# Patient Record
Sex: Female | Born: 1955 | Race: White | Hispanic: No | Marital: Married | Smoking: Never smoker
Health system: Southern US, Community
[De-identification: ages and names within clinical notes are randomized; demographics above are authoritative.]

## PROBLEM LIST (undated history)

## (undated) DIAGNOSIS — E785 Hyperlipidemia, unspecified: Secondary | ICD-10-CM

## (undated) DIAGNOSIS — I10 Essential (primary) hypertension: Secondary | ICD-10-CM

## (undated) DIAGNOSIS — R7303 Prediabetes: Secondary | ICD-10-CM

## (undated) DIAGNOSIS — I509 Heart failure, unspecified: Secondary | ICD-10-CM

## (undated) HISTORY — PX: TUBAL LIGATION: SHX77

## (undated) HISTORY — DX: Heart failure, unspecified: I50.9

---

## 1999-07-11 ENCOUNTER — Other Ambulatory Visit: Admission: RE | Admit: 1999-07-11 | Discharge: 1999-07-11 | Payer: Self-pay | Admitting: Obstetrics and Gynecology

## 2015-04-04 ENCOUNTER — Emergency Department: Payer: Self-pay

## 2015-04-04 ENCOUNTER — Encounter: Payer: Self-pay | Admitting: Emergency Medicine

## 2015-04-04 ENCOUNTER — Emergency Department
Admission: EM | Admit: 2015-04-04 | Discharge: 2015-04-04 | Disposition: A | Discharge status: Home / Self Care | Payer: Self-pay | Attending: Emergency Medicine | Admitting: Emergency Medicine

## 2015-04-04 DIAGNOSIS — I10 Essential (primary) hypertension: Secondary | ICD-10-CM | POA: Insufficient documentation

## 2015-04-04 DIAGNOSIS — J40 Bronchitis, not specified as acute or chronic: Secondary | ICD-10-CM

## 2015-04-04 DIAGNOSIS — J209 Acute bronchitis, unspecified: Secondary | ICD-10-CM | POA: Insufficient documentation

## 2015-04-04 HISTORY — DX: Essential (primary) hypertension: I10

## 2015-04-04 LAB — CBC WITH DIFFERENTIAL/PLATELET
BASOS ABS: 0.1 10*3/uL (ref 0–0.1)
BASOS PCT: 1 %
EOS PCT: 1 %
Eosinophils Absolute: 0.3 10*3/uL (ref 0–0.7)
HCT: 46 % (ref 35.0–47.0)
Hemoglobin: 15.3 g/dL (ref 12.0–16.0)
Lymphocytes Relative: 9 %
Lymphs Abs: 1.6 10*3/uL (ref 1.0–3.6)
MCH: 29.6 pg (ref 26.0–34.0)
MCHC: 33.2 g/dL (ref 32.0–36.0)
MCV: 89.1 fL (ref 80.0–100.0)
MONO ABS: 1 10*3/uL — AB (ref 0.2–0.9)
Monocytes Relative: 6 %
NEUTROS ABS: 14.4 10*3/uL — AB (ref 1.4–6.5)
NEUTROS PCT: 83 %
PLATELETS: 267 10*3/uL (ref 150–440)
RBC: 5.17 MIL/uL (ref 3.80–5.20)
RDW: 13.3 % (ref 11.5–14.5)
WBC: 17.3 10*3/uL — AB (ref 3.6–11.0)

## 2015-04-04 LAB — BASIC METABOLIC PANEL
ANION GAP: 11 (ref 5–15)
BUN: 10 mg/dL (ref 6–20)
CALCIUM: 9.1 mg/dL (ref 8.9–10.3)
CO2: 25 mmol/L (ref 22–32)
Chloride: 104 mmol/L (ref 101–111)
Creatinine, Ser: 0.74 mg/dL (ref 0.44–1.00)
Glucose, Bld: 140 mg/dL — ABNORMAL HIGH (ref 65–99)
Potassium: 3.3 mmol/L — ABNORMAL LOW (ref 3.5–5.1)
Sodium: 140 mmol/L (ref 135–145)

## 2015-04-04 MED ORDER — IOHEXOL 350 MG/ML SOLN
75.0000 mL | Freq: Once | INTRAVENOUS | Status: AC | PRN
  Administered 2015-04-04: 75 mL via INTRAVENOUS

## 2015-04-04 MED ORDER — AZITHROMYCIN 250 MG PO TABS
ORAL_TABLET | ORAL | Status: AC
Start: 2015-04-04 — End: 2015-04-09

## 2015-04-04 MED ORDER — ALBUTEROL SULFATE (2.5 MG/3ML) 0.083% IN NEBU
5.0000 mg | INHALATION_SOLUTION | Freq: Once | RESPIRATORY_TRACT | Status: AC
  Administered 2015-04-04: 2.5 mg via RESPIRATORY_TRACT
  Filled 2015-04-04: qty 6

## 2015-04-04 MED ORDER — ALBUTEROL SULFATE HFA 108 (90 BASE) MCG/ACT IN AERS: 2.0000 | INHALATION_SPRAY | Freq: Four times a day (QID) | RESPIRATORY_TRACT | Status: DC | PRN

## 2015-04-04 MED ORDER — METHYLPREDNISOLONE SODIUM SUCC 125 MG IJ SOLR
125.0000 mg | Freq: Once | INTRAMUSCULAR | Status: AC
  Administered 2015-04-04: 125 mg via INTRAVENOUS
  Filled 2015-04-04: qty 2

## 2015-04-04 MED ORDER — IPRATROPIUM-ALBUTEROL 0.5-2.5 (3) MG/3ML IN SOLN
3.0000 mL | Freq: Once | RESPIRATORY_TRACT | Status: AC
  Administered 2015-04-04: 3 mL via RESPIRATORY_TRACT

## 2015-04-04 MED ORDER — IPRATROPIUM-ALBUTEROL 0.5-2.5 (3) MG/3ML IN SOLN
RESPIRATORY_TRACT | Status: AC
  Administered 2015-04-04: 3 mL via RESPIRATORY_TRACT
  Filled 2015-04-04: qty 3

## 2015-04-04 MED ORDER — PREDNISONE 20 MG PO TABS: 60.0000 mg | ORAL_TABLET | Freq: Every day | ORAL | Status: DC

## 2015-04-04 MED ORDER — AZITHROMYCIN 250 MG PO TABS
500.0000 mg | ORAL_TABLET | Freq: Once | ORAL | Status: AC
  Administered 2015-04-04: 500 mg via ORAL
  Filled 2015-04-04: qty 2

## 2015-04-04 MED ORDER — SODIUM CHLORIDE 0.9 % IV BOLUS (SEPSIS)
500.0000 mL | Freq: Once | INTRAVENOUS | Status: AC
  Administered 2015-04-04: 500 mL via INTRAVENOUS

## 2015-04-04 NOTE — ED Provider Notes (Addendum)
[facility] Emergency Department Provider Note  ____________________________________________   I have reviewed the triage vital signs and the nursing notes.   HISTORY  Chief Complaint Sore Throat; Nasal Congestion; and Cough    HPI Pamela Kramer is a 59 y.o. female with a history of hypertension, poor compliance, does not usually go to doctors, presents today with URI symptoms including runny nose, cough, and wheeze. Cough is occasionally productive. No fever. States it all started today or yesterday. Feels like possibly pneumonia. Has not had any chest pain. Denies leg swelling. Patient was given an albuterol neb here and feels much better. Positive sick contacts at home.  Past Medical History  Diagnosis Date  . Hypertension     There are no active problems to display for this patient.   Past Surgical History  Procedure Laterality Date  . Tubal ligation      No current outpatient prescriptions on file.  Allergies Review of patient's allergies indicates no known allergies.  History reviewed. No pertinent family history.  Social History Social History  Substance Use Topics  . Smoking status: Never Smoker   . Smokeless tobacco: None  . Alcohol Use: 0.6 oz/week    1 Glasses of wine per week    Review of Systems Constitutional: No fever/chills Eyes: No visual changes. ENT: Positive sore throat. No stiff neck no neck pain positive rhinorrhea Cardiovascular: Denies chest pain. Respiratory: Positive shortness of breath. Gastrointestinal:   no vomiting.  No diarrhea.  No constipation. Genitourinary: Negative for dysuria. Musculoskeletal: Negative lower extremity swelling Skin: Negative for rash. Neurological: Negative for headaches, focal weakness or numbness. 10-point ROS otherwise negative.  ____________________________________________   PHYSICAL EXAM:  VITAL SIGNS: ED Triage Vitals  Enc Vitals Group     BP 04/04/15 1701 162/108  mmHg     Pulse Rate 04/04/15 1701 109     Resp 04/04/15 1701 18     Temp 04/04/15 1701 98.6 F (37 C)     Temp Source 04/04/15 1701 Oral     SpO2 04/04/15 1701 95 %     Weight 04/04/15 1701 280 lb (127.007 kg)     Height 04/04/15 1701  (1.676 m)     Head Cir --      Peak Flow --      Pain Score 04/04/15 1827 4     Pain Loc --      Pain Edu? --      Excl. in GC? --     Constitutional: Alert and oriented. Well appearing and in no acute distress. Eyes: Conjunctivae are normal. PERRL. EOMI. Head: Atraumatic. Nose: Positive minor congestion/rhinnorhea. Mouth/Throat: Mucous membranes are moist.  Oropharynx slightly erythematous Neck: No stridor.   Nontender with no meningismus Cardiovascular: Normal rate, regular rhythm. Grossly normal heart sounds.  Good peripheral circulation. Respiratory: Normal respiratory effort.  No retractions. Good air movement bilaterally with Slightly diminished in the bases Abdominal: Soft and nontender. No distention. No guarding no rebound morbidly obese Back:  There is no focal tenderness or step off there is no midline tenderness there are no lesions noted. there is no CVA tenderness  Musculoskeletal: No lower extremity tenderness. No joint effusions, no DVT signs strong distal pulses no edema Neurologic:  Normal speech and language. No gross focal neurologic deficits are appreciated.  Skin:  Skin is warm, dry and intact. No rash noted. Psychiatric: Mood and affect are normal. Speech and behavior are normal.  ____________________________________________   LABS (all labs ordered are  listed, but only abnormal results are displayed)  Labs Reviewed  CBC WITH DIFFERENTIAL/PLATELET - Abnormal; Notable for the following:    WBC 17.3 (*)    Neutro Abs 14.4 (*)    Monocytes Absolute 1.0 (*)    All other components within normal limits  BASIC METABOLIC PANEL - Abnormal; Notable for the following:    Potassium 3.3 (*)    Glucose, Bld 140 (*)    All  other components within normal limits   ____________________________________________  EKG  I personally interpreted any EKGs ordered by me or triage EKG shows sinus tachycardia, rate 102 no acute ST elevation or ST depression normal axis. Unremarkable EKG ____________________________________________  RADIOLOGY  I reviewed any imaging ordered by me or triage that were performed during my shift ____________________________________________   PROCEDURES  Procedure(s) performed: None  Critical Care performed: None  ____________________________________________   INITIAL IMPRESSION / ASSESSMENT AND PLAN / ED COURSE  Pertinent labs & imaging results that were available during my care of the patient were reviewed by me and considered in my medical decision making (see chart for details).  Patient here with viral URI symptoms of short duration, low suspicion for PE ACS dissection myocarditis endocarditis pericarditis or other acute intrathoracic pathology such as dissection however, chest x-ray which does not show an infiltrate, which is consistent with exam, does show questionable mediastinal mass and they've requested CT which we have obtained. As we are obtaining a CT at the request of radiology, we are checking blood work. Her white count is mildly elevated, her renal function is reassuring. We'll obtain a CT scan as a precaution. Patient doing much better after the nebulizer. Given that she was wheezing initially, we are giving her Solu-Medrol. We will see if there is anything on CT. I did offer to have the patient follow-up as an outpatient for the CT scan, but she states she has no good follow-up already established although she can always go see her husband's doctor she needs to, and she asked that we make sure everything was okay prior to discharge. Patient does have elevated blood pressure here but she has a long history of it it is a symptomatically she has intact renal function no  evidence of pulmonary edema at this time, no evidence of acute ischemic change, and we will defer further management of her hypertension to her outpatient physician whom she will follow up with closely she states ____________________________________________ ----------------------------------------- 8:57 PM on 04/04/2015 -----------------------------------------  As anticipated, CT scan is unremarkable aside from what is likely a viral process. All the findings have been explained to her. 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 We'll reassess patient and see how she is feeling at this time. Slight tachycardia is noted after albuterol. CT does show some "bronchitic" changes in the left lung. Focal changes this variety could be a precursor to pneumonia and I will give her antibiotics I feel this might help her. She is in no respiratory distress she does feel like she would benefit from another albuterol we'll give her that that before discharge this I and this we will elevate her heart rate which we will tolerate. Patient states that she has never been on blood pressure medication but been told that she has high blood pressure before. I will not start that at this moment but we will advise close follow-up and have advised close follow-up as an outpatient for recheck of blood pressure.Marland Kitchen  FINAL CLINICAL IMPRESSION(S) / ED DIAGNOSES  Final diagnoses:  None     Jeanmarie PlantJames A Raela Bohl, MD 04/04/15 2042  Jeanmarie PlantJames A Alaiah Lundy, MD 04/04/15 [id number]  Jeanmarie PlantJames A Keino Placencia, MD 04/04/15 2104

## 2015-04-04 NOTE — ED Notes (Signed)
MD Mcshane at bedside. 

## 2015-04-04 NOTE — ED Notes (Addendum)
Pt presents to ER with chest congestion and sore throat since yesterday. " I feel like I got pneumonia"

## 2015-04-04 NOTE — Discharge Instructions (Signed)
We ask that you follow-up closely with her primary care doctor or your husbands if he can get in to see them. Otherwise we will refer you to 1 here. 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 There is nothing to do for that emergently but we asked him to follow closely with your doctor. In addition, your blood pressure was elevated here, we will not start blood pressure medication as that is not the role of the emergency department at this time but we do asked to follow closely as an outpatient to get her blood pressure rechecked and possibly structures off on blood pressure medication. If you have chest pain shortness of breath nausea vomiting weakness or you feel worse in any way return emergently to the emergency department.

## 2015-04-04 NOTE — ED Notes (Signed)
Patient transported to CT 

## 2015-04-04 NOTE — ED Notes (Signed)
Pt will be d.c once meds are given and fluid bolus completes. Pt made aware and verbalized understanding at this time

## 2017-03-05 DIAGNOSIS — E1159 Type 2 diabetes mellitus with other circulatory complications: Secondary | ICD-10-CM | POA: Diagnosis present

## 2017-03-06 DIAGNOSIS — E119 Type 2 diabetes mellitus without complications: Secondary | ICD-10-CM

## 2017-03-06 DIAGNOSIS — E1169 Type 2 diabetes mellitus with other specified complication: Secondary | ICD-10-CM | POA: Diagnosis present

## 2017-05-08 ENCOUNTER — Emergency Department
Admission: EM | Admit: 2017-05-08 | Discharge: 2017-05-08 | Disposition: A | Discharge status: Home / Self Care | Payer: Self-pay | Attending: Emergency Medicine | Admitting: Emergency Medicine

## 2017-05-08 ENCOUNTER — Emergency Department: Payer: Self-pay

## 2017-05-08 ENCOUNTER — Encounter: Payer: Self-pay | Admitting: Emergency Medicine

## 2017-05-08 DIAGNOSIS — Y92009 Unspecified place in unspecified non-institutional (private) residence as the place of occurrence of the external cause: Secondary | ICD-10-CM | POA: Insufficient documentation

## 2017-05-08 DIAGNOSIS — Y999 Unspecified external cause status: Secondary | ICD-10-CM | POA: Insufficient documentation

## 2017-05-08 DIAGNOSIS — Y939 Activity, unspecified: Secondary | ICD-10-CM | POA: Insufficient documentation

## 2017-05-08 DIAGNOSIS — Z7984 Long term (current) use of oral hypoglycemic drugs: Secondary | ICD-10-CM | POA: Insufficient documentation

## 2017-05-08 DIAGNOSIS — X501XXA Overexertion from prolonged static or awkward postures, initial encounter: Secondary | ICD-10-CM | POA: Insufficient documentation

## 2017-05-08 DIAGNOSIS — R7303 Prediabetes: Secondary | ICD-10-CM | POA: Insufficient documentation

## 2017-05-08 DIAGNOSIS — I10 Essential (primary) hypertension: Secondary | ICD-10-CM | POA: Insufficient documentation

## 2017-05-08 DIAGNOSIS — S93491A Sprain of other ligament of right ankle, initial encounter: Secondary | ICD-10-CM | POA: Insufficient documentation

## 2017-05-08 HISTORY — DX: Prediabetes: R73.03

## 2017-05-08 HISTORY — DX: Hyperlipidemia, unspecified: E78.5

## 2017-05-08 MED ORDER — TRAMADOL HCL 50 MG PO TABS: 50.0000 mg | ORAL_TABLET | Freq: Four times a day (QID) | ORAL | 0 refills | Status: AC | PRN

## 2017-05-08 NOTE — ED Triage Notes (Signed)
Patient presents to ED via POV from home with c/o right foot pain since Sunday. Patient states she slipped and fell on the ice and hurt her foot. Denies LOC or hitting head.

## 2017-05-08 NOTE — ED Notes (Signed)
See triage note  States she rolled her ankle earlier today  Increased apin and swelling noted  Positive pulses noted

## 2017-05-08 NOTE — ED Provider Notes (Signed)
[facility] Emergency Department Provider Note  ____________________________________________  Time seen: Approximately 3:03 PM  I have reviewed the triage vital signs and the nursing notes.   HISTORY  Chief Complaint Foot Pain    HPI Pamela Kramer is a 61 y.o. female presents to the emergency department with right ankle pain after patient sustained an inversion type ankle injury 2 days ago.  Patient denies hitting her head during fall.  She denies weakness, radiculopathy or changes in sensation of the lower extremities.  No skin compromise.  Patient currently rates her pain at 8 out of 10 in intensity.  No alleviating measures have been attempted.   Past Medical History:  Diagnosis Date  . Hyperlipidemia   . Hypertension   . Pre-diabetes     There are no active problems to display for this patient.   Past Surgical History:  Procedure Laterality Date  . TUBAL LIGATION      Prior to Admission medications   Medication Sig Start Date End Date Taking? Authorizing Provider  losartan (COZAAR) 50 MG tablet Take 50 mg by mouth daily.   Yes [provider]  metFORMIN (GLUCOPHAGE) 500 MG tablet Take 500 mg by mouth 2 (two) times daily with a meal.   Yes [provider]  albuterol (PROVENTIL HFA;VENTOLIN HFA) 108 (90 BASE) MCG/ACT inhaler Inhale 2 puffs into the lungs every 6 (six) hours as needed for wheezing or shortness of breath. 04/04/15   Jeanmarie PlantMcShane, James A, MD  predniSONE (DELTASONE) 20 MG tablet Take 3 tablets (60 mg total) by mouth daily. 04/04/15   Jeanmarie PlantMcShane, James A, MD  traMADol (ULTRAM) 50 MG tablet Take 1 tablet (50 mg total) by mouth every 6 (six) hours as needed for up to 3 days. 05/08/17 05/11/17  Orvil FeilWoods, Desere Gwin M, PA-C    Allergies Patient has no known allergies.  No family history on file.  Social History Social History   Tobacco Use  . Smoking status: Never Smoker  Substance Use Topics  . Alcohol use: Yes   Alcohol/week: 0.6 oz    Types: 1 Glasses of wine per week  . Drug use: No     Review of Systems  Constitutional: No fever/chills Eyes: No visual changes. No discharge ENT: No upper respiratory complaints. Cardiovascular: no chest pain. Respiratory: no cough. No SOB. Musculoskeletal: Patient has right ankle pain. Skin: Negative for rash, abrasions, lacerations, ecchymosis. Neurological: Negative for headaches, focal weakness or numbness.   ____________________________________________   PHYSICAL EXAM:  VITAL SIGNS: ED Triage Vitals [05/08/17 1259]  Enc Vitals Group     BP (!) 146/79     Pulse Rate 84     Resp 16     Temp 98.2 F (36.8 C)     Temp Source Oral     SpO2 94 %     Weight 286 lb (129.7 kg)     Height 5\' 6"  (1.676 m)     Head Circumference      Peak Flow      Pain Score 8     Pain Loc      Pain Edu?      Excl. in GC?      Constitutional: Alert and oriented. Well appearing and in no acute distress. Eyes: Conjunctivae are normal. PERRL. EOMI. Head: Atraumatic. Cardiovascular: Normal rate, regular rhythm. Normal S1 and S2.  Good peripheral circulation. Respiratory: Normal respiratory effort without tachypnea or retractions. Lungs CTAB. Good air entry to the bases with no decreased or absent  breath sounds. Musculoskeletal: Patient is able to perform limited range of motion at the right ankle, likely secondary to pain.  She is able to move all 5 right toes and has no pain with palpation over the right fibula.  Palpable dorsalis pedis pulse bilaterally and symmetrically. Neurologic:  Normal speech and language. No gross focal neurologic deficits are appreciated.  Skin:  Skin is warm, dry and intact. No rash noted. Psychiatric: Mood and affect are normal. Speech and behavior are normal. Patient exhibits appropriate insight and judgement.   ____________________________________________   LABS (all labs ordered are listed, but only abnormal results are  displayed)  Labs Reviewed - No data to display ____________________________________________  EKG   ____________________________________________  RADIOLOGY Geraldo PitterI, Jaedan Huttner M Miquel Stacks, personally viewed and evaluated these images (plain radiographs) as part of my medical decision making, as well as reviewing the written report by the radiologist.  Dg Foot Complete Right  Result Date: 05/08/2017 CLINICAL DATA:  Right foot pain since patient suffered a slip and fall on ice 05/06/2017. Initial encounter. EXAM: RIGHT FOOT COMPLETE - 3+ VIEW COMPARISON:  None. FINDINGS: There is no evidence of fracture or dislocation. There is no evidence of arthropathy or other focal bone abnormality. Plantar calcaneal spur is noted. Scattered soft tissue calcifications are most consistent with some remote infectious or inflammatory process. IMPRESSION: No acute abnormality. Electronically Signed   By: Drusilla Kannerhomas  Dalessio M.D.   On: 05/08/2017 14:25    ____________________________________________    PROCEDURES  Procedure(s) performed:    Procedures    Medications - No data to display   ____________________________________________   INITIAL IMPRESSION / ASSESSMENT AND PLAN / ED COURSE  Pertinent labs & imaging results that were available during my care of the patient were reviewed by me and considered in my medical decision making (see chart for details).  Review of the  [place] was performed in accordance of the [facility] prior to dispensing any controlled drugs.    Assessment and plan Right ankle sprain Patient presents to the emergency department with right ankle pain after sustaining an inversion type ankle injury.  Differential diagnosis originally included fracture versus ankle sprain.  History and physical exam findings are consistent with ankle sprain.  Ace wrap was applied in the emergency department and crutches were provided.  Patient was discharged with a short course of tramadol and she was  referred to podiatry.  All patient questions were answered.   ____________________________________________  FINAL CLINICAL IMPRESSION(S) / ED DIAGNOSES  Final diagnoses:  Sprain of anterior talofibular ligament of right ankle, initial encounter      NEW MEDICATIONS STARTED DURING THIS VISIT:  ED Discharge Orders        Ordered    traMADol (ULTRAM) 50 MG tablet  Every 6 hours PRN     05/08/17 1508          This chart was dictated using voice recognition software/Dragon. Despite best efforts to proofread, errors can occur which can change the meaning. Any change was purely unintentional.    Orvil FeilWoods, Kayvon Mo M, PA-C 05/08/17 1512    Pershing ProudSchaevitz, Myra Rudeavid Matthew, MD 05/09/17 1311

## 2019-03-11 ENCOUNTER — Encounter: Payer: Self-pay | Admitting: Emergency Medicine

## 2019-03-11 ENCOUNTER — Other Ambulatory Visit: Payer: Self-pay

## 2019-03-11 ENCOUNTER — Emergency Department: Payer: Self-pay

## 2019-03-11 ENCOUNTER — Inpatient Hospital Stay
Admission: EM | Admit: 2019-03-11 | Discharge: 2019-03-15 | DRG: 291 | Disposition: A | Discharge status: Home / Self Care | Payer: Self-pay | Attending: Internal Medicine | Admitting: Internal Medicine

## 2019-03-11 DIAGNOSIS — E876 Hypokalemia: Secondary | ICD-10-CM | POA: Diagnosis present

## 2019-03-11 DIAGNOSIS — R7303 Prediabetes: Secondary | ICD-10-CM | POA: Diagnosis present

## 2019-03-11 DIAGNOSIS — Z23 Encounter for immunization: Secondary | ICD-10-CM

## 2019-03-11 DIAGNOSIS — I509 Heart failure, unspecified: Secondary | ICD-10-CM

## 2019-03-11 DIAGNOSIS — E785 Hyperlipidemia, unspecified: Secondary | ICD-10-CM | POA: Diagnosis present

## 2019-03-11 DIAGNOSIS — Z7952 Long term (current) use of systemic steroids: Secondary | ICD-10-CM

## 2019-03-11 DIAGNOSIS — Z7984 Long term (current) use of oral hypoglycemic drugs: Secondary | ICD-10-CM

## 2019-03-11 DIAGNOSIS — I110 Hypertensive heart disease with heart failure: Principal | ICD-10-CM | POA: Diagnosis present

## 2019-03-11 DIAGNOSIS — Z79899 Other long term (current) drug therapy: Secondary | ICD-10-CM

## 2019-03-11 DIAGNOSIS — Z886 Allergy status to analgesic agent status: Secondary | ICD-10-CM

## 2019-03-11 DIAGNOSIS — Z6841 Body Mass Index (BMI) 40.0 and over, adult: Secondary | ICD-10-CM

## 2019-03-11 DIAGNOSIS — Z77120 Contact with and (suspected) exposure to mold (toxic): Secondary | ICD-10-CM

## 2019-03-11 DIAGNOSIS — I5021 Acute systolic (congestive) heart failure: Secondary | ICD-10-CM | POA: Diagnosis present

## 2019-03-11 DIAGNOSIS — J9601 Acute respiratory failure with hypoxia: Secondary | ICD-10-CM | POA: Diagnosis present

## 2019-03-11 DIAGNOSIS — Z20828 Contact with and (suspected) exposure to other viral communicable diseases: Secondary | ICD-10-CM | POA: Diagnosis present

## 2019-03-11 LAB — CBC WITH DIFFERENTIAL/PLATELET
Abs Immature Granulocytes: 0.03 10*3/uL (ref 0.00–0.07)
Basophils Absolute: 0 10*3/uL (ref 0.0–0.1)
Basophils Relative: 0 %
Eosinophils Absolute: 0.2 10*3/uL (ref 0.0–0.5)
Eosinophils Relative: 2 %
HCT: 44.8 % (ref 36.0–46.0)
Hemoglobin: 14.4 g/dL (ref 12.0–15.0)
Immature Granulocytes: 0 %
Lymphocytes Relative: 13 %
Lymphs Abs: 1.3 10*3/uL (ref 0.7–4.0)
MCH: 29.9 pg (ref 26.0–34.0)
MCHC: 32.1 g/dL (ref 30.0–36.0)
MCV: 93.1 fL (ref 80.0–100.0)
Monocytes Absolute: 0.5 10*3/uL (ref 0.1–1.0)
Monocytes Relative: 6 %
Neutro Abs: 7.5 10*3/uL (ref 1.7–7.7)
Neutrophils Relative %: 79 %
Platelets: 258 10*3/uL (ref 150–400)
RBC: 4.81 MIL/uL (ref 3.87–5.11)
RDW: 13.2 % (ref 11.5–15.5)
WBC: 9.6 10*3/uL (ref 4.0–10.5)
nRBC: 0 % (ref 0.0–0.2)

## 2019-03-11 LAB — COMPREHENSIVE METABOLIC PANEL
ALT: 44 U/L (ref 0–44)
AST: 39 U/L (ref 15–41)
Albumin: 4.1 g/dL (ref 3.5–5.0)
Alkaline Phosphatase: 80 U/L (ref 38–126)
Anion gap: 13 (ref 5–15)
BUN: 16 mg/dL (ref 8–23)
CO2: 25 mmol/L (ref 22–32)
Calcium: 9 mg/dL (ref 8.9–10.3)
Chloride: 102 mmol/L (ref 98–111)
Creatinine, Ser: 0.84 mg/dL (ref 0.44–1.00)
GFR calc Af Amer: >60 mL/min (ref >60)
GFR calc non Af Amer: >60 mL/min (ref >60)
Glucose, Bld: 139 mg/dL — ABNORMAL HIGH (ref 70–99)
Potassium: 4.3 mmol/L (ref 3.5–5.1)
Sodium: 140 mmol/L (ref 135–145)
Total Bilirubin: 0.7 mg/dL (ref 0.3–1.2)
Total Protein: 7.2 g/dL (ref 6.5–8.1)

## 2019-03-11 LAB — HEMOGLOBIN A1C
Hgb A1c MFr Bld: 6.1 % — ABNORMAL HIGH (ref 4.8–5.6)
Mean Plasma Glucose: 128.37 mg/dL

## 2019-03-11 LAB — TROPONIN I (HIGH SENSITIVITY): Troponin I (High Sensitivity): 95 ng/L — ABNORMAL HIGH (ref <18)

## 2019-03-11 LAB — SARS CORONAVIRUS 2 (TAT 6-24 HRS): SARS Coronavirus 2: NEGATIVE

## 2019-03-11 LAB — BRAIN NATRIURETIC PEPTIDE: B Natriuretic Peptide: 381 pg/mL — ABNORMAL HIGH (ref 0.0–100.0)

## 2019-03-11 MED ORDER — SENNOSIDES-DOCUSATE SODIUM 8.6-50 MG PO TABS
1.0000 | ORAL_TABLET | Freq: Every evening | ORAL | Status: DC | PRN
  Filled 2019-03-11: qty 1

## 2019-03-11 MED ORDER — FUROSEMIDE 10 MG/ML IJ SOLN
60.0000 mg | Freq: Once | INTRAMUSCULAR | Status: AC
  Administered 2019-03-11: 60 mg via INTRAVENOUS
  Filled 2019-03-11: qty 8

## 2019-03-11 MED ORDER — METOPROLOL TARTRATE 5 MG/5ML IV SOLN
2.5000 mg | INTRAVENOUS | Status: DC | PRN
  Filled 2019-03-11: qty 5

## 2019-03-11 MED ORDER — ENOXAPARIN SODIUM 40 MG/0.4ML ~~LOC~~ SOLN
40.0000 mg | SUBCUTANEOUS | Status: DC
  Administered 2019-03-12 – 2019-03-14 (×3): 40 mg via SUBCUTANEOUS
  Filled 2019-03-11 (×3): qty 0.4

## 2019-03-11 MED ORDER — BISACODYL 5 MG PO TBEC
5.0000 mg | DELAYED_RELEASE_TABLET | Freq: Every day | ORAL | Status: DC | PRN
  Filled 2019-03-11: qty 1

## 2019-03-11 MED ORDER — ACETAMINOPHEN 325 MG PO TABS: 650.0000 mg | ORAL_TABLET | Freq: Four times a day (QID) | ORAL | Status: DC | PRN

## 2019-03-11 MED ORDER — DIPHENHYDRAMINE HCL 25 MG PO CAPS: 25.0000 mg | ORAL_CAPSULE | Freq: Four times a day (QID) | ORAL | Status: DC | PRN

## 2019-03-11 MED ORDER — SODIUM CHLORIDE 0.9 % IV SOLN: 250.0000 mL | INTRAVENOUS | Status: DC | PRN

## 2019-03-11 MED ORDER — ALBUTEROL SULFATE (2.5 MG/3ML) 0.083% IN NEBU: 2.5000 mg | INHALATION_SOLUTION | RESPIRATORY_TRACT | Status: DC | PRN

## 2019-03-11 MED ORDER — FUROSEMIDE 10 MG/ML IJ SOLN
40.0000 mg | Freq: Two times a day (BID) | INTRAMUSCULAR | Status: DC
  Administered 2019-03-12 (×2): 40 mg via INTRAVENOUS
  Filled 2019-03-11 (×4): qty 4

## 2019-03-11 MED ORDER — SODIUM CHLORIDE 0.9% FLUSH
3.0000 mL | Freq: Two times a day (BID) | INTRAVENOUS | Status: DC
  Administered 2019-03-11 – 2019-03-15 (×7): 3 mL via INTRAVENOUS

## 2019-03-11 MED ORDER — ONDANSETRON HCL 4 MG PO TABS
4.0000 mg | ORAL_TABLET | Freq: Four times a day (QID) | ORAL | Status: DC | PRN
  Filled 2019-03-11: qty 1

## 2019-03-11 MED ORDER — ONDANSETRON HCL 4 MG/2ML IJ SOLN
4.0000 mg | Freq: Four times a day (QID) | INTRAMUSCULAR | Status: DC | PRN
  Administered 2019-03-13: 4 mg via INTRAVENOUS
  Filled 2019-03-11: qty 2

## 2019-03-11 MED ORDER — ACETAMINOPHEN 650 MG RE SUPP
650.0000 mg | Freq: Four times a day (QID) | RECTAL | Status: DC | PRN
  Filled 2019-03-11: qty 1

## 2019-03-11 MED ORDER — PANTOPRAZOLE SODIUM 40 MG PO TBEC
40.0000 mg | DELAYED_RELEASE_TABLET | Freq: Every day | ORAL | Status: DC
  Administered 2019-03-11 – 2019-03-15 (×5): 40 mg via ORAL
  Filled 2019-03-11 (×6): qty 1

## 2019-03-11 MED ORDER — SODIUM CHLORIDE 0.9% FLUSH: 3.0000 mL | INTRAVENOUS | Status: DC | PRN

## 2019-03-11 MED ORDER — HYDROCODONE-ACETAMINOPHEN 5-325 MG PO TABS
1.0000 | ORAL_TABLET | ORAL | Status: DC | PRN
  Administered 2019-03-12: 1 via ORAL
  Administered 2019-03-14: 2 via ORAL
  Administered 2019-03-14: 1 via ORAL
  Filled 2019-03-11 (×2): qty 1
  Filled 2019-03-11: qty 2

## 2019-03-11 MED ORDER — IPRATROPIUM-ALBUTEROL 0.5-2.5 (3) MG/3ML IN SOLN
3.0000 mL | Freq: Four times a day (QID) | RESPIRATORY_TRACT | Status: DC
  Administered 2019-03-11 – 2019-03-13 (×9): 3 mL via RESPIRATORY_TRACT
  Filled 2019-03-11 (×8): qty 3

## 2019-03-11 MED ORDER — FUROSEMIDE 10 MG/ML IJ SOLN: 40.0000 mg | Freq: Two times a day (BID) | INTRAMUSCULAR | Status: DC

## 2019-03-11 MED ORDER — LISINOPRIL 5 MG PO TABS
5.0000 mg | ORAL_TABLET | Freq: Every day | ORAL | Status: DC
  Administered 2019-03-11 – 2019-03-13 (×3): 5 mg via ORAL
  Filled 2019-03-11 (×4): qty 1

## 2019-03-11 NOTE — Progress Notes (Signed)
Advanced Care Plan.  Purpose of Encounter: CODE STATUS. Parties in Attendance: The patient and me. Patient's Decisional Capacity: Yes. Medical Story: Pamela Kramer  is a 63 y.o. female with a known history of hypertension, hyperlipidemia and prediabetes.  The patient is being admitted for acute respiratory failure with hypoxia due to acute new onset CHF.  I discussed with patient about her current condition, prognosis and CODE STATUS.  The patient hesitated to answer the question about CODE STATUS. She agrees to be resuscitated and intubated if she has cardiopulmonary arrest.  Plan:  Code Status: Full code. Time spent discussing advance care planning: 17 minutes.

## 2019-03-11 NOTE — ED Triage Notes (Signed)
Patient presents to ED via [facility] from home due to shortness of breath x 3 weeks. EMS report SpO2 89% on room air. On arrival patient is on 4L of oxygen. Labored respirations noted. Patient gets winded speaking full sentences. SpO2 96% on room air. Denies pain.

## 2019-03-11 NOTE — ED Notes (Signed)
Patient continues to rest. Call light within reach. Will continue to monitor.

## 2019-03-11 NOTE — H&P (Signed)
[place] [facility] [place] NAME: Pamela Kramer    MR#:  [id number]  DATE OF BIRTH:  April 28, 1956  DATE OF ADMISSION:  03/11/2019  PRIMARY CARE PHYSICIAN: System, Pcp Not In   REQUESTING/REFERRING PHYSICIAN: Harvest Dark, MD  CHIEF COMPLAINT:   Chief Complaint  Patient presents with  . Shortness of Breath   Shortness of breath for 3 weeks worsening for a few days. HISTORY OF PRESENT ILLNESS:  Pamela Kramer  is a 63 y.o. female with a known history of hypertension, hyperlipidemia and prediabetes.  The patient presents the ED with above chief complaints.  She has had shortness of breath on exertion for the past 3 weeks which has been worsening for the past few days.  She also complains of orthopnea and nocturnal dyspnea.  She denies any leg edema.  She denies any fever or chills.  She is found hypoxia and put on oxygen by nasal cannula 4 L in the ED.  Chest x-ray show CHF.  She is treated with Lasix 60 mg IV in the ED. PAST MEDICAL HISTORY:   Past Medical History:  Diagnosis Date  . Hyperlipidemia   . Hypertension   . Pre-diabetes     PAST SURGICAL HISTORY:   Past Surgical History:  Procedure Laterality Date  . TUBAL LIGATION      SOCIAL HISTORY:   Social History   Tobacco Use  . Smoking status: Never Smoker  Substance Use Topics  . Alcohol use: Yes    Alcohol/week: 1.0 standard drinks    Types: 1 Glasses of wine per week    FAMILY HISTORY:  No family history on file.  Father had cancer, mother had diabetes.  DRUG ALLERGIES:  No Known Allergies  REVIEW OF SYSTEMS:   Review of Systems  Constitutional: Positive for malaise/fatigue. Negative for chills and fever.  HENT: Negative for sore throat.   Eyes: Negative for blurred vision and double vision.  Respiratory: Positive for cough, sputum production and shortness of breath. Negative for hemoptysis, wheezing and stridor.   Cardiovascular: Negative for chest pain,  palpitations, orthopnea and leg swelling.  Gastrointestinal: Negative for abdominal pain, blood in stool, diarrhea, melena, nausea and vomiting.  Genitourinary: Negative for dysuria, flank pain and hematuria.  Musculoskeletal: Negative for back pain and joint pain.  Skin: Negative for rash.  Neurological: Negative for dizziness, sensory change, focal weakness, seizures, loss of consciousness, weakness and headaches.  Endo/Heme/Allergies: Negative for polydipsia.  Psychiatric/Behavioral: Negative for depression. The patient is not nervous/anxious.     MEDICATIONS AT HOME:   Prior to Admission medications   Medication Sig Start Date End Date Taking? Authorizing Provider  albuterol (PROVENTIL HFA;VENTOLIN HFA) 108 (90 BASE) MCG/ACT inhaler Inhale 2 puffs into the lungs every 6 (six) hours as needed for wheezing or shortness of breath. 04/04/15   Schuyler Amor, MD  losartan (COZAAR) 50 MG tablet Take 50 mg by mouth daily.    [provider]  metFORMIN (GLUCOPHAGE) 500 MG tablet Take 500 mg by mouth 2 (two) times daily with a meal.    [provider]  predniSONE (DELTASONE) 20 MG tablet Take 3 tablets (60 mg total) by mouth daily. 04/04/15   Schuyler Amor, MD      VITAL SIGNS:  Blood pressure (!) 165/104, pulse (!) 108, temperature 98.9 F (37.2 C), temperature source Oral, resp. rate (!) 22, height 5\' 6"  (1.676 m), weight 136.1 kg, SpO2 93 %.  PHYSICAL EXAMINATION:  Physical Exam  GENERAL:  63 y.o.-year-old patient lying in the bed with no acute distress.  Morbid obesity. EYES: Pupils equal, round, reactive to light and accommodation. No scleral icterus. Extraocular muscles intact.  HEENT: Head atraumatic, normocephalic. NECK:  Supple, no jugular venous distention. No thyroid enlargement, no tenderness.  LUNGS: Basilar Rales bilaterally, no wheezing, rhonchi or crepitation. No use of accessory muscles of respiration.  CARDIOVASCULAR: S1, S2 normal. No murmurs, rubs,  or gallops.  ABDOMEN: Soft, nontender, nondistended. Bowel sounds present. No organomegaly or mass.  EXTREMITIES: No pedal edema, cyanosis, or clubbing.  NEUROLOGIC: Cranial nerves II through XII are intact. Muscle strength 5/5 in all extremities. Sensation intact. Gait not checked.  PSYCHIATRIC: The patient is alert and oriented x 3.  SKIN: No obvious rash, lesion, or ulcer.   LABORATORY PANEL:   CBC Recent Labs  Lab 03/11/19 0916  WBC 9.6  HGB 14.4  HCT 44.8  PLT 258   ------------------------------------------------------------------------------------------------------------------  Chemistries  Recent Labs  Lab 03/11/19 0916  NA 140  K 4.3  CL 102  CO2 25  GLUCOSE 139*  BUN 16  CREATININE 0.84  CALCIUM 9.0  AST 39  ALT 44  ALKPHOS 80  BILITOT 0.7   ------------------------------------------------------------------------------------------------------------------  Cardiac Enzymes No results for input(s): TROPONINI in the last 168 hours. ------------------------------------------------------------------------------------------------------------------  RADIOLOGY:  Dg Chest Portable 1 View  Result Date: 03/11/2019 CLINICAL DATA:  Shortness of breath. EXAM: PORTABLE CHEST 1 VIEW COMPARISON:  CT 04/05/2015. FINDINGS: Cardiomegaly with diffuse bilateral interstitial prominence and small bilateral pleural effusions. Findings most consistent CHF. Pneumonitis cannot be excluded. No pneumothorax. IMPRESSION: Cardiomegaly with diffuse bilateral interstitial prominence and small bilateral pleural effusions. Findings most consistent with CHF. Electronically Signed   By: Maisie Fus  Register   On: 03/11/2019 09:18      IMPRESSION AND PLAN:   Acute respiratory failure with hypoxia due to acute CHF, unknown type. The patient will be admitted to telemetry floor. Continue oxygen by nasal cannula, albuterol every 6 hours. Start CHF protocol, Lasix 40 mg IV every 12 hours,  echocardiograph and cardiology consult.  Hypertension, accelerated.  Continue hypertension medication.  IV LOPRESSOR PRN. Morbid obesity.  Diet control and follow-up PCP.Marland Kitchen Prediabetes.  Check hemoglobin A1c.  All the records are reviewed and case discussed with ED provider. Management plans discussed with the patient, family and they are in agreement.  CODE STATUS:  TOTAL TIME TAKING CARE OF THIS PATIENT: 56 minutes.    Shaune Pollack M.D on 03/11/2019 at 10:42 AM  Between 7am to 6pm - Pager - [phone]  After 6pm go to [email or web address] - Social research officer, government  [employer]  Office  [phone]  CC: Primary care physician; System, Pcp Not In   Note: This dictation was prepared with Dragon dictation along with smaller phrase technology. Any transcriptional errors that result from this process are unin

## 2019-03-11 NOTE — ED Notes (Signed)
Patient resting at this time. Work of breathing has improved. Call light within reach. Will continue to monitor.

## 2019-03-11 NOTE — ED Provider Notes (Signed)
[facility] Emergency Department Provider Note  Time seen: 8:49 AM  I have reviewed the triage vital signs and the nursing notes.   HISTORY  Chief Complaint Shortness of Breath   HPI Pamela Kramer is a 63 y.o. female with a past medical history of hypertension, hyperlipidemia presents to the emergency department for shortness of breath.   According to the patient over the past 3 weeks she has been feeling short of breath.  EMS states initial O2 saturation of 89% on room air, no baseline O2 requirement.  No underlying lung disease as per patient.  Patient was brought to the emergency department on 4 L of oxygen, however upon arrival oxygen was removed and the patient continues to sat in the mid upper 90s on room air.  Patient states a very slight cough at times.  Denies any fever.  Denies any pain in the chest.  No history of shortness of breath previously per patient.  Patient is currently breathing between 30 and 40 breaths/min.  Past Medical History:  Diagnosis Date  . Hyperlipidemia   . Hypertension   . Pre-diabetes     There are no active problems to display for this patient.   Past Surgical History:  Procedure Laterality Date  . TUBAL LIGATION      Prior to Admission medications   Medication Sig Start Date End Date Taking? Authorizing Provider  albuterol (PROVENTIL HFA;VENTOLIN HFA) 108 (90 BASE) MCG/ACT inhaler Inhale 2 puffs into the lungs every 6 (six) hours as needed for wheezing or shortness of breath. 04/04/15   Schuyler Amor, MD  losartan (COZAAR) 50 MG tablet Take 50 mg by mouth daily.    [provider]  metFORMIN (GLUCOPHAGE) 500 MG tablet Take 500 mg by mouth 2 (two) times daily with a meal.    [provider]  predniSONE (DELTASONE) 20 MG tablet Take 3 tablets (60 mg total) by mouth daily. 04/04/15   Schuyler Amor, MD    No Known Allergies  No family history on file.  Social History Social History   Tobacco  Use  . Smoking status: Never Smoker  Substance Use Topics  . Alcohol use: Yes    Alcohol/week: 1.0 standard drinks    Types: 1 Glasses of wine per week  . Drug use: No    Review of Systems Constitutional: Negative for fever. Cardiovascular: Negative for chest pain. Respiratory: Positive for shortness of breath x3 weeks.  Slight cough. Gastrointestinal: Negative for abdominal pain, vomiting and diarrhea. Musculoskeletal: Negative for musculoskeletal complaints Neurological: Negative for headache All other ROS negative  ____________________________________________   PHYSICAL EXAM:  VITAL SIGNS: ED Triage Vitals [03/11/19 0847]  Enc Vitals Group     BP (!) 165/129     Pulse Rate (!) 113     Resp (!) 30     Temp 98.9 F (37.2 C)     Temp Source Oral     SpO2 97 %     Weight 300 lb (136.1 kg)     Height 5\' 6"  (1.676 m)     Head Circumference      Peak Flow      Pain Score 0     Pain Loc      Pain Edu?      Excl. in [place]?     Constitutional: Alert and oriented. Well appearing and in no distress. Eyes: Normal exam ENT      Head: Normocephalic and atraumatic.  Mouth/Throat: Mucous membranes are moist. Cardiovascular: Normal rate, regular rhythm. No murmur Respiratory: Patient is tachypneic speaking in 2-3 word sentences due to shortness of breath.  However clear lung sounds to auscultation without any obvious wheeze rales or rhonchi. Gastrointestinal: Soft and nontender. No distention. Musculoskeletal: Nontender with normal range of motion in all extremities. No lower extremity tenderness or edema. Neurologic:  Normal speech and language. No gross focal neurologic deficits  Skin:  Skin is warm, dry and intact.  Psychiatric: Mood and affect are normal. Speech and behavior are normal.   ____________________________________________    EKG  EKG viewed and interpreted by myself shows sinus tachycardia 112 bpm with a narrow QRS, normal axis, normal intervals with no  concerning ST changes.  ____________________________________________    RADIOLOGY  IMPRESSION:  Cardiomegaly with diffuse bilateral interstitial prominence and  small bilateral pleural effusions. Findings most consistent with  CHF.   ____________________________________________   INITIAL IMPRESSION / ASSESSMENT AND PLAN / ED COURSE  Pertinent labs & imaging results that were available during my care of the patient were reviewed by me and considered in my medical decision making (see chart for details).   Patient presents to the emergency department for shortness of breath x3 weeks.  Differential would include pneumonia, ACS, pulmonary edema, infectious etiology such as COVID, pulmonary embolism.  We will check labs, chest x-ray and EKG.    Patient's EKG shows sinus tachycardia otherwise largely nonrevealing.  Chest x-ray shows cardiomegaly with interstitial prominence with mild pleural effusions most consistent with CHF exacerbation.  Remaining lab work is pending at this time.  Patient's lab work has resulted showing mild troponin elevation at 95 as well as a BNP elevation into the 300s again consistent with CHF exacerbation.  We will dose IV Lasix.  We will admit to the hospitalist service for further work-up and treatment.   Pamela Kramer was evaluated in Emergency Department on 03/11/2019 for the symptoms described in the history of present illness. She was evaluated in the context of the global COVID-19 pandemic, which necessitated consideration that the patient might be at risk for infection with the SARS-CoV-2 virus that causes COVID-19. Institutional protocols and algorithms that pertain to the evaluation of patients at risk for COVID-19 are in a state of rapid change based on information released by regulatory bodies including the CDC and federal and state organizations. These policies and algorithms were followed during the patient's care in the  ED.  ____________________________________________   FINAL CLINICAL IMPRESSION(S) / ED DIAGNOSES  Dyspnea Congestive heart failure   Minna Antis, MD 03/11/19 [phone]

## 2019-03-12 ENCOUNTER — Inpatient Hospital Stay
Admit: 2019-03-12 | Discharge: 2019-03-12 | Disposition: A | Discharge status: Home / Self Care | Payer: Self-pay | Attending: Internal Medicine | Admitting: Internal Medicine

## 2019-03-12 LAB — BASIC METABOLIC PANEL
Anion gap: 12 (ref 5–15)
BUN: 15 mg/dL (ref 8–23)
CO2: 26 mmol/L (ref 22–32)
Calcium: 8.8 mg/dL — ABNORMAL LOW (ref 8.9–10.3)
Chloride: 105 mmol/L (ref 98–111)
Creatinine, Ser: 0.81 mg/dL (ref 0.44–1.00)
GFR calc Af Amer: >60 mL/min (ref >60)
GFR calc non Af Amer: >60 mL/min (ref >60)
Glucose, Bld: 125 mg/dL — ABNORMAL HIGH (ref 70–99)
Potassium: 3.4 mmol/L — ABNORMAL LOW (ref 3.5–5.1)
Sodium: 143 mmol/L (ref 135–145)

## 2019-03-12 LAB — CBC
HCT: 44.1 % (ref 36.0–46.0)
Hemoglobin: 13.9 g/dL (ref 12.0–15.0)
MCH: 29.2 pg (ref 26.0–34.0)
MCHC: 31.5 g/dL (ref 30.0–36.0)
MCV: 92.6 fL (ref 80.0–100.0)
Platelets: 254 10*3/uL (ref 150–400)
RBC: 4.76 MIL/uL (ref 3.87–5.11)
RDW: 13 % (ref 11.5–15.5)
WBC: 9.3 10*3/uL (ref 4.0–10.5)
nRBC: 0 % (ref 0.0–0.2)

## 2019-03-12 LAB — MAGNESIUM: Magnesium: 2.1 mg/dL (ref 1.7–2.4)

## 2019-03-12 LAB — HIV ANTIBODY (ROUTINE TESTING W REFLEX): HIV Screen 4th Generation wRfx: NONREACTIVE

## 2019-03-12 MED ORDER — POTASSIUM CHLORIDE CRYS ER 20 MEQ PO TBCR
40.0000 meq | EXTENDED_RELEASE_TABLET | Freq: Once | ORAL | Status: AC
  Administered 2019-03-12: 40 meq via ORAL
  Filled 2019-03-12: qty 2

## 2019-03-12 MED ORDER — INFLUENZA VAC SPLIT QUAD 0.5 ML IM SUSY
0.5000 mL | PREFILLED_SYRINGE | INTRAMUSCULAR | Status: AC
  Administered 2019-03-13: 0.5 mL via INTRAMUSCULAR
  Filled 2019-03-12: qty 0.5

## 2019-03-12 NOTE — Progress Notes (Signed)
*  PRELIMINARY RESULTS* Echocardiogram 2D Echocardiogram has been performed.  Sherrie Sport 03/12/2019, 8:31 AM

## 2019-03-12 NOTE — Progress Notes (Signed)
Pamela Kramer at [place] NAME: Pamela Kramer    MR#:  [id number]  DATE OF BIRTH:  06-15-1955  SUBJECTIVE:   Shortness of breath has improved.  REVIEW OF SYSTEMS:    Review of Systems  Constitutional: Negative for fever, chills weight loss HENT: Negative for ear pain, nosebleeds, congestion, facial swelling, rhinorrhea, neck pain, neck stiffness and ear discharge.   Respiratory: Negative for cough,++ shortness of breath, wheezing  Cardiovascular: Negative for chest pain, palpitations and ++leg swelling.  Gastrointestinal: Negative for heartburn, abdominal pain, vomiting, diarrhea or consitpation Genitourinary: Negative for dysuria, urgency, frequency, hematuria Musculoskeletal: Negative for back pain or joint pain Neurological: Negative for dizziness, seizures, syncope, focal weakness,  numbness and headaches.  Hematological: Does not bruise/bleed easily.  Psychiatric/Behavioral: Negative for hallucinations, confusion, dysphoric mood    Tolerating Diet: yes      DRUG ALLERGIES:   Allergies  Allergen Reactions  . Excedrin Migraine  [Aspirin-Acetaminophen-Caffeine] Anaphylaxis    VITALS:  Blood pressure (!) 141/94, pulse 87, temperature (!) 97.5 F (36.4 C), temperature source Oral, resp. rate 18, height 5\' 6"  (1.676 m), weight 133.9 kg, SpO2 96 %.  PHYSICAL EXAMINATION:  Constitutional: Appears obese No distress. HENT: Normocephalic. Marland Kitchen Oropharynx is clear and moist.  Eyes: Conjunctivae and EOM are normal. PERRLA, no scleral icterus.  Neck: Normal ROM. Neck supple. No JVD. No tracheal deviation. CVS: RRR, S1/S2 +, no murmurs, no gallops, no carotid bruit.  Pulmonary: Effort and breath sounds normal, no stridor, rhonchi, wheezes, rales.  Abdominal: Soft. BS +,  no distension, tenderness, rebound or guarding.  Musculoskeletal: Normal range of motion. 1+ LEE .  Neuro: Alert. CN 2-12 grossly intact. No focal deficits. Skin: Skin is  warm and dry. No rash noted. Psychiatric: Normal mood and affect.      LABORATORY PANEL:   CBC Recent Labs  Lab 03/12/19 0415  WBC 9.3  HGB 13.9  HCT 44.1  PLT 254   ------------------------------------------------------------------------------------------------------------------  Chemistries  Recent Labs  Lab 03/11/19 0916 03/12/19 0415  NA 140 143  K 4.3 3.4*  CL 102 105  CO2 25 26  GLUCOSE 139* 125*  BUN 16 15  CREATININE 0.84 0.81  CALCIUM 9.0 8.8*  MG  --  2.1  AST 39  --   ALT 44  --   ALKPHOS 80  --   BILITOT 0.7  --    ------------------------------------------------------------------------------------------------------------------  Cardiac Enzymes No results for input(s): TROPONINI in the last 168 hours. ------------------------------------------------------------------------------------------------------------------  RADIOLOGY:  Dg Chest Portable 1 View  Result Date: 03/11/2019 CLINICAL DATA:  Shortness of breath. EXAM: PORTABLE CHEST 1 VIEW COMPARISON:  CT 04/05/2015. FINDINGS: Cardiomegaly with diffuse bilateral interstitial prominence and small bilateral pleural effusions. Findings most consistent CHF. Pneumonitis cannot be excluded. No pneumothorax. IMPRESSION: Cardiomegaly with diffuse bilateral interstitial prominence and small bilateral pleural effusions. Findings most consistent with CHF. Electronically Signed   By: Marcello Moores  Register   On: 03/11/2019 09:18     ASSESSMENT AND PLAN:   63 year old female with obesity and hypertension who presented to the emergency room due to increasing shortness of breath.  1.  Acute congestive heart failure: Echocardiogram is pending to evaluate systolic versus diastolic.  Patient seems to be improving with Lasix. Continue IV Lasix Monitor intake and output with daily weight Add beta-blocker once patient is euvolemic Dietitian consult requested 2.  Essential hypertension: Continue lisinopril  3.   Hypokalemia: Replete as needed  4.  Obesity: Encouraged weight loss  as tolerated      Management plans discussed with the patient and she is in agreement.  CODE STATUS: full  TOTAL TIME TAKING CARE OF THIS PATIENT: 30 minutes.     POSSIBLE D/Ct omorrow, DEPENDING ON CLINICAL CONDITION.   Adrian Saran M.D on 03/12/2019 at 11:58 AM  Between 7am to 6pm - Pager - [phone] After 6pm go to [email or web address] - password [email or web address]  [facility]  Office  [phone]  CC: Primary care physician; System, Pcp Not In  Note: This dictation was prepared with Dragon dictation along with smaller phrase technology. Any transcriptional errors that result from this process are unintentional.

## 2019-03-12 NOTE — Progress Notes (Signed)
Received to room 243 from ER via stretcher. Assisted to bed and positioned for comfort. In no acute distress and denies pain. Oriented to room, bed and unit.

## 2019-03-12 NOTE — TOC Initial Note (Addendum)
Transition of Care [facility]) - Initial/Assessment Note    Patient Details  Name: Pamela Kramer MRN: [id number] Date of Birth: [birth year]  Transition of [facility]) CM/SW Contact:    Elza Rafter, RN Phone Number: 03/12/2019, 1:57 PM  Clinical Narrative:   From home with spouse; admitted with SOB and swelling; acute CHF.  Does not have a PCP.  Her husband goes to [employer].  She would like to follow up with them as a new patient.  Zigmund Daniel, unit clerk made new patient appointment.  It is near her house in [place].  She states she will make a new patient appointment at [place].  Currently on 2L oxygen acute.  Will attempt to wean.  No DME needs at home.   Independent in all ADL's.  Does not qualify for home health and declines.  Will have HF clinic appointment at [place].  Does not have a scale at home.  This RNCM provided.  Uninsured.  Financial counseling consult.  Can assist with medications at DC through medication management.                 Expected Discharge Plan: Home/Self Care Barriers to Discharge: Continued Medical Work up   Patient Goals and CMS Choice        Expected Discharge Plan and Services Expected Discharge Plan: Home/Self Care   Discharge Planning Services: HF Clinic, CM Consult   Living arrangements for the past 2 months: Single Family Home                           HH Arranged: Refused HH          Prior Living Arrangements/Services Living arrangements for the past 2 months: Single Family Home Lives with:: Spouse   Do you feel safe going back to the place where you live?: Yes            Criminal Activity/Legal Involvement Pertinent to Current Situation/Hospitalization: No - Comment as needed  Activities of Daily Living Home Assistive Devices/Equipment: None ADL Screening (condition at time of admission) Patient's cognitive ability adequate to safely complete daily activities?: Yes Is the patient deaf or have difficulty hearing?: No Does the patient  have difficulty seeing, even when wearing glasses/contacts?: No Does the patient have difficulty concentrating, remembering, or making decisions?: No Patient able to express need for assistance with ADLs?: Yes Does the patient have difficulty dressing or bathing?: No Independently performs ADLs?: Yes (appropriate for developmental age) Does the patient have difficulty walking or climbing stairs?: No Weakness of Legs: None Weakness of Arms/Hands: None  Permission Sought/Granted                  Emotional Assessment Appearance:: Appears stated age Attitude/Demeanor/Rapport: Gracious Affect (typically observed): Accepting Orientation: : Oriented to Self, Oriented to Place, Oriented to  Time, Oriented to Situation Alcohol / Substance Use: Not Applicable Psych Involvement: No (comment)  Admission diagnosis:  Acute congestive heart failure, unspecified heart failure type [facility]) [I50.9] Patient Active Problem List   Diagnosis Date Noted  . Acute CHF (congestive heart failure) ([place]) 03/11/2019   PCP:  System, Pcp Not In Pharmacy:   [id number] [address], [address] [place] [address] Phone: [phone] Fax: [phone]     Social Determinants of Health (SDOH) Interventions    Readmission Risk Interventions No flowsheet data found.

## 2019-03-13 LAB — GLUCOSE, CAPILLARY: Glucose-Capillary: 124 mg/dL — ABNORMAL HIGH (ref 70–99)

## 2019-03-13 LAB — BASIC METABOLIC PANEL
Anion gap: 8 (ref 5–15)
BUN: 17 mg/dL (ref 8–23)
CO2: 29 mmol/L (ref 22–32)
Calcium: 9.2 mg/dL (ref 8.9–10.3)
Chloride: 104 mmol/L (ref 98–111)
Creatinine, Ser: 0.74 mg/dL (ref 0.44–1.00)
GFR calc Af Amer: >60 mL/min (ref >60)
GFR calc non Af Amer: >60 mL/min (ref >60)
Glucose, Bld: 122 mg/dL — ABNORMAL HIGH (ref 70–99)
Potassium: 4.1 mmol/L (ref 3.5–5.1)
Sodium: 141 mmol/L (ref 135–145)

## 2019-03-13 LAB — ECHOCARDIOGRAM COMPLETE
Height: 66 in
Weight: 4724.8 oz

## 2019-03-13 MED ORDER — FUROSEMIDE 10 MG/ML IJ SOLN
40.0000 mg | Freq: Three times a day (TID) | INTRAMUSCULAR | Status: DC
  Administered 2019-03-13 (×2): 40 mg via INTRAVENOUS
  Filled 2019-03-13 (×2): qty 4

## 2019-03-13 MED ORDER — IPRATROPIUM-ALBUTEROL 0.5-2.5 (3) MG/3ML IN SOLN
3.0000 mL | Freq: Three times a day (TID) | RESPIRATORY_TRACT | Status: DC
  Administered 2019-03-14 – 2019-03-15 (×4): 3 mL via RESPIRATORY_TRACT
  Filled 2019-03-13 (×4): qty 3

## 2019-03-13 MED ORDER — SODIUM CHLORIDE 0.9% FLUSH
10.0000 mL | Freq: Two times a day (BID) | INTRAVENOUS | Status: DC
  Administered 2019-03-13 – 2019-03-15 (×4): 10 mL

## 2019-03-13 MED ORDER — SODIUM CHLORIDE 0.9% FLUSH: 10.0000 mL | INTRAVENOUS | Status: DC | PRN

## 2019-03-13 MED ORDER — SODIUM CHLORIDE 0.9 % IV BOLUS
250.0000 mL | Freq: Once | INTRAVENOUS | Status: AC
  Administered 2019-03-13: 250 mL via INTRAVENOUS

## 2019-03-13 MED ORDER — METOPROLOL TARTRATE 25 MG PO TABS
25.0000 mg | ORAL_TABLET | Freq: Two times a day (BID) | ORAL | Status: DC
  Administered 2019-03-13 (×2): 25 mg via ORAL
  Filled 2019-03-13 (×2): qty 1

## 2019-03-13 NOTE — Progress Notes (Signed)
[facility] [place] [facility]   PATIENT NAME: Pamela Kramer    MR#:  [id number]  DATE OF BIRTH:  [birth year]  SUBJECTIVE:   Still with SOB but improving slowly  REVIEW OF SYSTEMS:    Review of Systems  Constitutional: Negative for fever, chills weight loss HENT: Negative for ear pain, nosebleeds, congestion, facial swelling, rhinorrhea, neck pain, neck stiffness and ear discharge.   Respiratory: Negative for cough,++ shortness of breath-better, wheezing  Cardiovascular: Negative for chest pain, palpitations and ++leg swelling better.  Gastrointestinal: Negative for heartburn, abdominal pain, vomiting, diarrhea or consitpation Genitourinary: Negative for dysuria, urgency, frequency, hematuria Musculoskeletal: Negative for back pain or joint pain Neurological: Negative for dizziness, seizures, syncope, focal weakness,  numbness and headaches.  Hematological: Does not bruise/bleed easily.  Psychiatric/Behavioral: Negative for hallucinations, confusion, dysphoric mood    Tolerating Diet: yes      DRUG ALLERGIES:   Allergies  Allergen Reactions  . Excedrin Migraine  [Aspirin-Acetaminophen-Caffeine] Anaphylaxis    VITALS:  Blood pressure 123/89, pulse 90, temperature 98.3 F (36.8 C), temperature source Oral, resp. rate 20, height 5\' 6"  (1.676 m), weight 133.3 kg, SpO2 90 %.  PHYSICAL EXAMINATION:  Constitutional: Appears obese No distress. HENT: Normocephalic. Oropharynx is clear and moist.  Eyes: Conjunctivae and EOM are normal. PERRLA, no scleral icterus.  Neck: Normal ROM. Neck supple. No JVD. No tracheal deviation. CVS: RRR, S1/S2 +, no murmurs, no gallops, no carotid bruit.  Pulmonary: Effort and breath sounds normal, no stridor, rhonchi, wheezes, rales.  Abdominal: Soft. BS +,  no distension, tenderness, rebound or guarding.  Musculoskeletal: Normal range of motion. 1+ LEE .  Neuro: Alert. CN 2-12 grossly intact. No focal deficits. Skin:  Skin is warm and dry. No rash noted. Psychiatric: Normal mood and affect.      LABORATORY PANEL:   CBC Recent Labs  Lab 03/12/19 0415  WBC 9.3  HGB 13.9  HCT 44.1  PLT 254   ------------------------------------------------------------------------------------------------------------------  Chemistries  Recent Labs  Lab 03/11/19 0916 03/12/19 0415 03/13/19 0540  NA 140 143 141  K 4.3 3.4* 4.1  CL 102 105 104  CO2 25 26 29   GLUCOSE 139* 125* 122*  BUN 16 15 17   CREATININE 0.84 0.81 0.74  CALCIUM 9.0 8.8* 9.2  MG  --  2.1  --   AST 39  --   --   ALT 44  --   --   ALKPHOS 80  --   --   BILITOT 0.7  --   --    ------------------------------------------------------------------------------------------------------------------  Cardiac Enzymes No results for input(s): TROPONINI in the last 168 hours. ------------------------------------------------------------------------------------------------------------------  RADIOLOGY:  No results found.   ASSESSMENT AND PLAN:   63 year old female with obesity and hypertension who presented to the emergency room due to increasing shortness of breath.  1.  Acute congestive heart failure: Ejection fraction 35 to 40% Continue IV Lasix 8 hours Monitor intake and output with daily weight Add metoprolol and continue lisinopril  Will need outpatient cardiac evaluation and outpatient cardiology evaluation  Will need CHF clinic upon discharge  Dietary consult which was requested yesterday still pending.    2.  Essential hypertension: Continue lisinopril and added metoprolol  3.  Hypokalemia: Replete as needed  4.  Obesity: Encouraged weight loss as tolerated      Management plans discussed with the patient and she is in agreement.  CODE STATUS: full  TOTAL TIME TAKING CARE OF THIS PATIENT: 30 minutes.  POSSIBLE D/C tomorrow, DEPENDING ON CLINICAL CONDITION.   Bettey Costa M.D on 03/13/2019 at 11:18 AM  Between  7am to 6pm - Pager - [phone] After 6pm go to [email or web address] - password [email or web address] [facility]  Office  [phone]  CC: Primary care physician; System, Pcp Not In  Note: This dictation was prepared with Dragon dictation along with smaller phrase technology. Any transcriptional errors that result from this process are unintentional.

## 2019-03-13 NOTE — Progress Notes (Signed)
Patient called out to the nursing desk complaining of an uneasy feeling. Entered room patient was diaphoretic, nauseous and dizzy. Patient was sitting on the side of the bed she just returned from the restroom. Patient is hypotensive. Blood sugar level is 124. Patient is Afib on the monitor, rate in the 110's. No complaints of pain. Paged physician. Dr. Jannifer Franklin gave orders to administer 250 bolus and hold lasix.

## 2019-03-13 NOTE — Plan of Care (Signed)
Nutrition Education Note RD working remotely.  RD consulted for nutrition education regarding new onset CHF.  RD spoke with patient via phone and discussed "Low Sodium Nutrition Therapy" handout from the Academy of [employer]etics. Educational handout will be provided to patient via e-mail per her request. Reviewed patient's dietary recall. Provided examples on ways to decrease sodium intake in diet. Discouraged intake of processed foods and use of salt shaker. Encouraged fresh fruits and vegetables as well as whole grain sources of carbohydrates to maximize fiber intake.   RD discussed why it is important for patient to adhere to diet recommendations, and emphasized the role of fluids, foods to avoid, and importance of weighing self daily. Teach back method used.  Expect fair compliance.  Body mass index is 47.42 kg/m. Pt meets criteria for morbid obesity based on current BMI.  Current diet order is 2 gram Na, patient is consuming approximately 100% of meals at this time. Labs and medications reviewed. No further nutrition interventions warranted at this time. RD contact information provided. If additional nutrition issues arise, please re-consult RD.   Lajuan Lines, RD, LDN Clinical Nutrition [place] Telephone [phone] After Hours/Weekend Pager: [phone]

## 2019-03-14 LAB — BASIC METABOLIC PANEL
Anion gap: 4 — ABNORMAL LOW (ref 5–15)
BUN: 20 mg/dL (ref 8–23)
CO2: 31 mmol/L (ref 22–32)
Calcium: 8.8 mg/dL — ABNORMAL LOW (ref 8.9–10.3)
Chloride: 102 mmol/L (ref 98–111)
Creatinine, Ser: 1.04 mg/dL — ABNORMAL HIGH (ref 0.44–1.00)
GFR calc Af Amer: >60 mL/min (ref >60)
GFR calc non Af Amer: 57 mL/min — ABNORMAL LOW (ref >60)
Glucose, Bld: 147 mg/dL — ABNORMAL HIGH (ref 70–99)
Potassium: 3.9 mmol/L (ref 3.5–5.1)
Sodium: 137 mmol/L (ref 135–145)

## 2019-03-14 LAB — GLUCOSE, CAPILLARY: Glucose-Capillary: 93 mg/dL (ref 70–99)

## 2019-03-14 MED ORDER — LISINOPRIL 2.5 MG PO TABS
2.5000 mg | ORAL_TABLET | Freq: Every day | ORAL | Status: DC
  Filled 2019-03-14 (×2): qty 1

## 2019-03-14 MED ORDER — METOPROLOL TARTRATE 25 MG PO TABS
12.5000 mg | ORAL_TABLET | Freq: Two times a day (BID) | ORAL | Status: DC
  Filled 2019-03-14: qty 1

## 2019-03-14 NOTE — Plan of Care (Signed)
  Problem: Education: Goal: Knowledge of General Education information will improve Description: Including pain rating scale, medication(s)/side effects and non-pharmacologic comfort measures Outcome: Progressing   Problem: Health Behavior/Discharge Planning: Goal: Ability to manage health-related needs will improve Outcome: Progressing   Problem: Clinical Measurements: Goal: Respiratory complications will improve Outcome: Progressing Goal: Cardiovascular complication will be avoided Outcome: Progressing   Problem: Education: Goal: Ability to demonstrate management of disease process will improve Outcome: Progressing Goal: Individualized Educational Video(s) Outcome: Progressing   Problem: Activity: Goal: Risk for activity intolerance will decrease Outcome: Completed/Met

## 2019-03-14 NOTE — Progress Notes (Signed)
[facility] [place] NAME: Pamela Kramer    MR#:  [id number]  DATE OF BIRTH:  1956-04-02  SUBJECTIVE:   Patient had low blood pressure yesterday.  REVIEW OF SYSTEMS:    Review of Systems  Constitutional: Negative for fever, chills weight loss HENT: Negative for ear pain, nosebleeds, congestion, facial swelling, rhinorrhea, neck pain, neck stiffness and ear discharge.   Respiratory: Negative for cough,++ shortness of breath-better, wheezing  Cardiovascular: Negative for chest pain, palpitations and ++leg swelling better.  Gastrointestinal: Negative for heartburn, abdominal pain, vomiting, diarrhea or consitpation Genitourinary: Negative for dysuria, urgency, frequency, hematuria Musculoskeletal: Negative for back pain or joint pain Neurological: Negative for dizziness, seizures, syncope, focal weakness,  numbness and headaches.  Hematological: Does not bruise/bleed easily.  Psychiatric/Behavioral: Negative for hallucinations, confusion, dysphoric mood    Tolerating Diet: yes      DRUG ALLERGIES:   Allergies  Allergen Reactions  . Excedrin Migraine  [Aspirin-Acetaminophen-Caffeine] Anaphylaxis    VITALS:  Blood pressure 97/65, pulse (!) 109, temperature 98.3 F (36.8 C), resp. rate 19, height 5\' 6"  (1.676 m), weight 133 kg, SpO2 93 %.  PHYSICAL EXAMINATION:  Constitutional: Appears obese No distress. HENT: Normocephalic. Marland Kitchen Oropharynx is clear and moist.  Eyes: Conjunctivae and EOM are normal. PERRLA, no scleral icterus.  Neck: Normal ROM. Neck supple. No JVD. No tracheal deviation. CVS: RRR, S1/S2 +, no murmurs, no gallops, no carotid bruit.  Pulmonary: Effort and breath sounds normal, no stridor, rhonchi, wheezes, rales.  Abdominal: Soft. BS +,  no distension, tenderness, rebound or guarding.  Musculoskeletal: Normal range of motion. 1+ LEE .  Neuro: Alert. CN 2-12 grossly intact. No focal deficits. Skin: Skin is warm and  dry. No rash noted. Psychiatric: Normal mood and affect.      LABORATORY PANEL:   CBC Recent Labs  Lab 03/12/19 0415  WBC 9.3  HGB 13.9  HCT 44.1  PLT 254   ------------------------------------------------------------------------------------------------------------------  Chemistries  Recent Labs  Lab 03/11/19 0916 03/12/19 0415  03/14/19 0356  NA 140 143   < > 137  K 4.3 3.4*   < > 3.9  CL 102 105   < > 102  CO2 25 26   < > 31  GLUCOSE 139* 125*   < > 147*  BUN 16 15   < > 20  CREATININE 0.84 0.81   < > 1.04*  CALCIUM 9.0 8.8*   < > 8.8*  MG  --  2.1  --   --   AST 39  --   --   --   ALT 44  --   --   --   ALKPHOS 80  --   --   --   BILITOT 0.7  --   --   --    < > = values in this interval not displayed.   ------------------------------------------------------------------------------------------------------------------  Cardiac Enzymes No results for input(s): TROPONINI in the last 168 hours. ------------------------------------------------------------------------------------------------------------------  RADIOLOGY:  No results found.   ASSESSMENT AND PLAN:   63 year old female with obesity and hypertension who presented to the emergency room due to increasing shortness of breath.  1.  Acute congestive heart failure: Ejection fraction 35 to 40% Patient euvolemic currently.  Creatinine increased.  Stop Lasix for today.  No metoprolol lisinopril due to low blood pressure.    Will need outpatient cardiac evaluation and outpatient cardiology evaluation  Will need CHF clinic upon discharge  Dietary consult which was  requested yesterday still pending.    2  Hypokalemia: Replete as needed  3 Obesity: Encouraged weight loss as tolerated She needs outpatient sleep evaluation.  This was discussed with her.     Management plans discussed with the patient and she is in agreement.  CODE STATUS: full  TOTAL TIME TAKING CARE OF THIS PATIENT: 29 minutes.      POSSIBLE D/C tomorrow, DEPENDING ON CLINICAL CONDITION.   Adrian Saran M.D on 03/14/2019 at 11:01 AM  Between 7am to 6pm - Pager - [phone] After 6pm go to [email or web address] - password [email or web address]  [facility]  Office  [phone]  CC: Primary care physician; System, Pcp Not In  Note: This dictation was prepared with Dragon dictation along with smaller phrase technology. Any transcriptional errors that result from this process are unintentional.

## 2019-03-14 NOTE — Progress Notes (Signed)
Nutrition Brief Note   RD received consult for CHF diet education. Pt previously educated this admit (see note from 10/15 ). If pt needs further diet education, consider referral to Nutrition and Diabetes Education Services at [facility]. Phone # [phone]  Koleen Distance MS, RD, [place] Pager #- [phone] Office#- [phone] After Hours Pager: [phone]

## 2019-03-14 NOTE — Plan of Care (Signed)
  Problem: Education: Goal: Knowledge of General Education information will improve Description: Including pain rating scale, medication(s)/side effects and non-pharmacologic comfort measures Outcome: Progressing   Problem: Health Behavior/Discharge Planning: Goal: Ability to manage health-related needs will improve Outcome: Progressing   Problem: Clinical Measurements: Goal: Ability to maintain clinical measurements within normal limits will improve Outcome: Progressing Goal: Will remain free from infection Outcome: Progressing Goal: Diagnostic test results will improve Outcome: Progressing Goal: Respiratory complications will improve Outcome: Progressing Goal: Cardiovascular complication will be avoided Outcome: Progressing   Problem: Nutrition: Goal: Adequate nutrition will be maintained Outcome: Progressing   Problem: Coping: Goal: Level of anxiety will decrease Outcome: Progressing   Problem: Elimination: Goal: Will not experience complications related to bowel motility Outcome: Progressing Goal: Will not experience complications related to urinary retention Outcome: Progressing   Problem: Pain Managment: Goal: General experience of comfort will improve Outcome: Progressing   Problem: Safety: Goal: Ability to remain free from injury will improve Outcome: Progressing   Problem: Skin Integrity: Goal: Risk for impaired skin integrity will decrease Outcome: Progressing   Problem: Education: Goal: Ability to demonstrate management of disease process will improve Outcome: Progressing Goal: Ability to verbalize understanding of medication therapies will improve Outcome: Progressing Goal: Individualized Educational Video(s) Outcome: Progressing   Problem: Activity: Goal: Capacity to carry out activities will improve Outcome: Progressing   Problem: Cardiac: Goal: Ability to achieve and maintain adequate cardiopulmonary perfusion will improve Outcome:  Progressing   

## 2019-03-14 NOTE — Progress Notes (Signed)
Spoke with patient regarding the outpatient Heart Failure Clinic. Explained the importance of weighing daily once she goes home and calling for an overnight weight gain of >2 pounds or a weekly weight gain of >5 pounds. Have scheduled a new patient appointment with the HF Clinic.  Thank you.

## 2019-03-14 NOTE — Progress Notes (Signed)
Blood pressure and MAP has improved. Patient is no longer complaining of dizziness, nausea or diaphoresis. Patient is currently resting in bed.

## 2019-03-15 ENCOUNTER — Inpatient Hospital Stay: Payer: Self-pay

## 2019-03-15 LAB — BASIC METABOLIC PANEL
Anion gap: 11 (ref 5–15)
BUN: 21 mg/dL (ref 8–23)
CO2: 29 mmol/L (ref 22–32)
Calcium: 8.8 mg/dL — ABNORMAL LOW (ref 8.9–10.3)
Chloride: 98 mmol/L (ref 98–111)
Creatinine, Ser: 0.78 mg/dL (ref 0.44–1.00)
GFR calc Af Amer: >60 mL/min (ref >60)
GFR calc non Af Amer: >60 mL/min (ref >60)
Glucose, Bld: 140 mg/dL — ABNORMAL HIGH (ref 70–99)
Potassium: 3.9 mmol/L (ref 3.5–5.1)
Sodium: 138 mmol/L (ref 135–145)

## 2019-03-15 MED ORDER — FUROSEMIDE 10 MG/ML IJ SOLN
40.0000 mg | INTRAMUSCULAR | Status: AC
  Administered 2019-03-15: 40 mg via INTRAVENOUS
  Filled 2019-03-15: qty 4

## 2019-03-15 MED ORDER — POTASSIUM CHLORIDE ER 10 MEQ PO TBCR: 10.0000 meq | EXTENDED_RELEASE_TABLET | Freq: Every day | ORAL | 0 refills | Status: DC

## 2019-03-15 MED ORDER — FUROSEMIDE 40 MG PO TABS: 40.0000 mg | ORAL_TABLET | Freq: Two times a day (BID) | ORAL | 0 refills | Status: DC

## 2019-03-15 NOTE — Progress Notes (Signed)
Discussed and educated patient on discharge instructions. Patient denied any questions. Patient was picked up by daughter to go home.

## 2019-03-15 NOTE — TOC Transition Note (Signed)
Transition of Care [facility]) - CM/SW Discharge Note   Patient Details  Name: [name] MRN: [id number] Date of Birth: 1955-12-20  [facility] of [facility] CM/SW Contact:  Marshell Garfinkel, RN Phone Number: 03/15/2019, 9:29 AM   Clinical Narrative:     Patient states she will return to home today with her husband "who has an albuterol inhaler". She states "money is not an issue"after I explained that cost of the inhaler may be an issues for her- advised her to go to [facility]. Inhaler was not e-scribes and lasix and Potassium was sent to [facility]- patient updated. She is not currently on supplemental O2. Patient states she feels fine. ENcouraged her to establish with [facility] (her choice) as she does not have health insurance. She agrees.  Final next level of care: Home/Self Care Barriers to Discharge: No Barriers Identified   Patient Goals and CMS Choice Patient states their goals for this hospitalization and ongoing recovery are:: Go home with husband and establish with [facility] because they have a pharmacy CMS Medicare.gov Compare Post Acute Care list provided to:: Patient    Discharge Placement                       Discharge Plan and Services   Discharge Planning Services: HF Clinic, CM Consult                      HH Arranged: Refused [facility]          Social Determinants of Health (SDOH) Interventions     Readmission Risk Interventions No flowsheet data found.

## 2019-03-15 NOTE — Progress Notes (Signed)
Patient walked around nurses station on rm air. 02 Saturation remained between 91-95%. Patient stated she was feeling much better.

## 2019-03-15 NOTE — Discharge Summary (Signed)
[place] [facility] [place] NAME: Pamela Kramer    MR#:  [id number]  DATE OF BIRTH:  [birth year]  DATE OF ADMISSION:  03/11/2019 ADMITTING PHYSICIAN: Demetrios Loll, MD  DATE OF DISCHARGE: 03/15/2019  PRIMARY CARE PHYSICIAN: System, Pcp Not In    ADMISSION DIAGNOSIS:  Acute congestive heart failure, unspecified heart failure type ([place]) [I50.9]  DISCHARGE DIAGNOSIS:  Active Problems:   Acute systolic CHF SECONDARY DIAGNOSIS:   Past Medical History:  Diagnosis Date  . Hyperlipidemia   . Hypertension   . Pre-diabetes     HOSPITAL COURSE:   63 year old female with obesity and hypertension who presented to the emergency room due to increasing shortness of breath.  1.  Acute hypoxic respiratory failure in setting of Acute congestive heart failure: Ejection fraction 35 to 40% Patient is euvolemic currently.   Her blood pressure cannot tolerate metoprolol or lisinopril.  We did try this however her blood pressure dropped.  I have asked her to continue to monitor her blood pressure and log it for her follow-up with her cardiologist.  She obviously would benefit from beta-blocker and ACE inhibitor/ARB therapy.  This may be able to be done as an outpatient.  She will be discharged on oral Lasix.  She is referred to CHF clinic upon discharge.  She will also need outpatient cardiac evaluation for her new CHF   2  Hypokalemia: This was repleted as needed  3 Obesity: Encouraged weight loss as tolerated She needs outpatient sleep evaluation.  This was discussed with her. She has also had mold exposure.  She is referred to outpatient pulmonary.   DISCHARGE CONDITIONS AND DIET:   Stable for discharge cardiac diet  CONSULTS OBTAINED:    DRUG ALLERGIES:   Allergies  Allergen Reactions  . Excedrin Migraine  [Aspirin-Acetaminophen-Caffeine] Anaphylaxis    DISCHARGE MEDICATIONS:   Allergies as of 03/15/2019      Reactions   Excedrin Migraine   [aspirin-acetaminophen-caffeine] Anaphylaxis      Medication List    STOP taking these medications   clindamycin 300 MG capsule Commonly known as: CLEOCIN     TAKE these medications   albuterol 108 (90 Base) MCG/ACT inhaler Commonly known as: VENTOLIN HFA Inhale 2 puffs into the lungs every 6 (six) hours as needed for wheezing or shortness of breath. What changed: when to take this   diphenhydrAMINE 25 MG tablet Commonly known as: BENADRYL Take 25 mg by mouth every 6 (six) hours as needed.   furosemide 40 MG tablet Commonly known as: Lasix Take 1 tablet (40 mg total) by mouth 2 (two) times daily.   ipratropium-albuterol 0.5-2.5 (3) MG/3ML Soln Commonly known as: DUONEB Take 3 mLs by nebulization 3 (three) times daily.   omeprazole 20 MG capsule Commonly known as: PRILOSEC Take 20 mg by mouth 2 (two) times daily.   potassium chloride 10 MEQ tablet Commonly known as: KLOR-CON Take 1 tablet (10 mEq total) by mouth daily.   vitamin C with rose hips 1000 MG tablet Take 1,000 mg by mouth 3 (three) times daily.            Durable Medical Equipment  (From admission, onward)         Start     Ordered   03/15/19 0854  DME Oxygen  Once    Question Answer Comment  Length of Need Lifetime   Mode or (Route) Nasal cannula   Liters per Minute 2   Frequency Continuous (stationary and  portable oxygen unit needed)   Oxygen conserving device Yes   Oxygen delivery system Gas      03/15/19 0855            Today   CHIEF COMPLAINT:   Patient is doing much better this morning.  Blood pressure still low for [place] to start beta-blocker and/or ACE inhibitor therapy   VITAL SIGNS:  Blood pressure (!) 121/91, pulse 81, temperature (!) 97.4 F (36.3 C), temperature source Oral, resp. rate 14, height 5\' 6"  (1.676 m), weight 133.4 kg, SpO2 99 %.   REVIEW OF SYSTEMS:  Review of Systems  Constitutional: Negative.  Negative for chills, fever and malaise/fatigue.  HENT:  Negative.  Negative for ear discharge, ear pain, hearing loss, nosebleeds and sore throat.   Eyes: Negative.  Negative for blurred vision and pain.  Respiratory: Negative.  Negative for cough, hemoptysis, shortness of breath and wheezing.   Cardiovascular: Negative.  Negative for chest pain, palpitations and leg swelling.  Gastrointestinal: Negative.  Negative for abdominal pain, blood in stool, diarrhea, nausea and vomiting.  Genitourinary: Negative.  Negative for dysuria.  Musculoskeletal: Negative.  Negative for back pain.  Skin: Negative.   Neurological: Negative for dizziness, tremors, speech change, focal weakness, seizures and headaches.  Endo/Heme/Allergies: Negative.  Does not bruise/bleed easily.  Psychiatric/Behavioral: Negative.  Negative for depression, hallucinations and suicidal ideas.     PHYSICAL EXAMINATION:  GENERAL:  63 y.o.-year-old patient lying in the bed with no acute distress.  NECK:  Supple, no jugular venous distention. No thyroid enlargement, no tenderness.  LUNGS: Normal breath sounds bilaterally, no wheezing, rales,rhonchi  No use of accessory muscles of respiration.  CARDIOVASCULAR: S1, S2 normal. No murmurs, rubs, or gallops.  ABDOMEN: Soft, non-tender, non-distended. Bowel sounds present. No organomegaly or mass.  EXTREMITIES: No pedal edema, cyanosis, or clubbing.  PSYCHIATRIC: The patient is alert and oriented x 3.  SKIN: No obvious rash, lesion, or ulcer.   DATA REVIEW:   CBC Recent Labs  Lab 03/12/19 0415  WBC 9.3  HGB 13.9  HCT 44.1  PLT 254    Chemistries  Recent Labs  Lab 03/11/19 0916 03/12/19 0415  03/15/19 0515  NA 140 143   < > 138  K 4.3 3.4*   < > 3.9  CL 102 105   < > 98  CO2 25 26   < > 29  GLUCOSE 139* 125*   < > 140*  BUN 16 15   < > 21  CREATININE 0.84 0.81   < > 0.78  CALCIUM 9.0 8.8*   < > 8.8*  MG  --  2.1  --   --   AST 39  --   --   --   ALT 44  --   --   --   ALKPHOS 80  --   --   --   BILITOT 0.7  --   --    --    < > = values in this interval not displayed.    Cardiac Enzymes No results for input(s): TROPONINI in the last 168 hours.  Microbiology Results  @MICRORSLT48 @  RADIOLOGY:  Dg Chest 1 View  Result Date: 03/15/2019 CLINICAL DATA:  CHF exacerbation EXAM: CHEST  1 VIEW COMPARISON:  03/11/2019 FINDINGS: Cardiomegaly with pulmonary vascular congestion. No frank interstitial edema. No definite pleural effusions. No pneumothorax. IMPRESSION: Cardiomegaly with pulmonary vascular congestion. No frank interstitial edema. Electronically Signed   By: 03/17/2019 M.D.   On:  03/15/2019 08:13      Allergies as of 03/15/2019      Reactions   Excedrin Migraine  [aspirin-acetaminophen-caffeine] Anaphylaxis      Medication List    STOP taking these medications   clindamycin 300 MG capsule Commonly known as: CLEOCIN     TAKE these medications   albuterol 108 (90 Base) MCG/ACT inhaler Commonly known as: VENTOLIN HFA Inhale 2 puffs into the lungs every 6 (six) hours as needed for wheezing or shortness of breath. What changed: when to take this   diphenhydrAMINE 25 MG tablet Commonly known as: BENADRYL Take 25 mg by mouth every 6 (six) hours as needed.   furosemide 40 MG tablet Commonly known as: Lasix Take 1 tablet (40 mg total) by mouth 2 (two) times daily.   ipratropium-albuterol 0.5-2.5 (3) MG/3ML Soln Commonly known as: DUONEB Take 3 mLs by nebulization 3 (three) times daily.   omeprazole 20 MG capsule Commonly known as: PRILOSEC Take 20 mg by mouth 2 (two) times daily.   potassium chloride 10 MEQ tablet Commonly known as: KLOR-CON Take 1 tablet (10 mEq total) by mouth daily.   vitamin C with rose hips 1000 MG tablet Take 1,000 mg by mouth 3 (three) times daily.            Durable Medical Equipment  (From admission, onward)         Start     Ordered   03/15/19 0854  DME Oxygen  Once    Question Answer Comment  Length of Need Lifetime   Mode or  (Route) Nasal cannula   Liters per Minute 2   Frequency Continuous (stationary and portable oxygen unit needed)   Oxygen conserving device Yes   Oxygen delivery system Gas      03/15/19 0855             Management plans discussed with the patient and she is in agreement. [facility]  Patient should follow up with pcp  CODE STATUS:     Code Status Orders  (From admission, onward)         Start     Ordered   03/11/19 1455  Full code  Continuous     03/11/19 1454        Code Status History    This patient has a current code status but no historical code status.   Advance Care Planning Activity      TOTAL TIME TAKING CARE OF THIS PATIENT: 38 minutes.    Note: This dictation was prepared with Dragon dictation along with smaller phrase technology. Any transcriptional errors that result from this process are unintentional.  Adrian SaranSital Marina Desire M.D on 03/15/2019 at 8:55 AM  Between 7am to 6pm - Pager - [phone] After 6pm go to [email or web address] - password [email or web address]  [facility] [place] [facility]  Office  [phone]  CC: Primary care physician; System, Pcp Not In

## 2019-03-26 NOTE — Progress Notes (Signed)
Patient ID: Pamela Kramer, female    DOB: 01-May-1956, 63 y.o.   MRN: [id number]  HPI  Ms Belknap is a 63 y/o female with a history of hyperlipidemia, HTN, obesity and heart failure.   Echo report from 03/12/2019 which showed an EF of 35-40% along with mild MR/TR.  Admitted 03/11/2019 due to acute heart failure. Initially treated with IV lasix and then transitioned to oral diuretics. Tried metoprolol and lisinopril with resultant hypotension. Discharged after 4 days.   She presents today for her initial visit with a chief complaint of minimal shortness of breath upon moderate exertion. She describes this as having been present for a few months although she says that it's gotten much better. She has associated chest tightness and light-headedness after she takes her lisinopril along with having a nagging dry cough. She denies any difficulty sleeping, abdominal distention, palpitations, pedal edema, chest pain, fatigue or weight gain.   Past Medical History:  Diagnosis Date  . CHF (congestive heart failure) (HCC)   . Hyperlipidemia   . Hypertension   . Pre-diabetes    Past Surgical History:  Procedure Laterality Date  . TUBAL LIGATION     History reviewed. No pertinent family history. Social History   Tobacco Use  . Smoking status: Never Smoker  . Smokeless tobacco: Never Used  Substance Use Topics  . Alcohol use: Yes    Alcohol/week: 1.0 standard drinks    Types: 1 Glasses of wine per week   Allergies  Allergen Reactions  . Excedrin Migraine  [Aspirin-Acetaminophen-Caffeine] Anaphylaxis   Prior to Admission medications   Medication Sig Start Date End Date Taking? Authorizing Provider  Ascorbic Acid (VITAMIN C WITH ROSE HIPS) 1000 MG tablet Take 1,000 mg by mouth 3 (three) times daily.   Yes [provider]  diphenhydrAMINE (BENADRYL) 25 MG tablet Take 25 mg by mouth every 6 (six) hours as needed.   Yes [provider]  furosemide (LASIX) 40 MG tablet Take 1  tablet (40 mg total) by mouth 2 (two) times daily. 03/15/19  Yes Mody, Sital, MD  ipratropium-albuterol (DUONEB) 0.5-2.5 (3) MG/3ML SOLN Take 3 mLs by nebulization 3 (three) times daily.   Yes [provider]  lisinopril (ZESTRIL) 5 MG tablet Take 5 mg by mouth daily.   Yes [provider]  omeprazole (PRILOSEC) 20 MG capsule Take 20 mg by mouth 2 (two) times daily.   Yes [provider]  potassium chloride (KLOR-CON) 10 MEQ tablet Take 1 tablet (10 mEq total) by mouth daily. 03/15/19  Yes Mody, Patricia Pesa, MD  albuterol (PROVENTIL HFA;VENTOLIN HFA) 108 (90 BASE) MCG/ACT inhaler Inhale 2 puffs into the lungs every 6 (six) hours as needed for wheezing or shortness of breath. Patient not taking: Reported on 03/28/2019 04/04/15   Jeanmarie Plant, MD     Review of Systems  Constitutional: Negative for appetite change and fatigue.  HENT: Negative for congestion, postnasal drip and sore throat.   Eyes: Negative.   Respiratory: Positive for cough, chest tightness (when taking lisinopril) and shortness of breath (minimal).        + snoring  Cardiovascular: Negative for chest pain, palpitations and leg swelling.  Gastrointestinal: Negative for abdominal distention and abdominal pain.  Endocrine: Negative.   Genitourinary: Negative.   Musculoskeletal: Positive for back pain (tightness in back after taking lisinopril). Negative for neck pain.  Skin: Negative.   Allergic/Immunologic: Negative.   Neurological: Positive for light-headedness (when taking lisinopril). Negative for dizziness.  Hematological: Negative  for adenopathy. Does not bruise/bleed easily.  Psychiatric/Behavioral: Negative for dysphoric mood and sleep disturbance (sleeping on 2 pillows). The patient is not nervous/anxious.    Vitals:   03/28/19 1215  BP: (!) 148/95  Pulse: 96  Resp: (!) 22  SpO2: 98%  Weight: 287 lb 9.6 oz (130.5 kg)  Height: 5\' 6"  (1.676 m)   Wt Readings from Last 3 Encounters:   03/28/19 287 lb 9.6 oz (130.5 kg)  03/15/19 294 lb 3.2 oz (133.4 kg)  05/08/17 286 lb (129.7 kg)   Lab Results  Component Value Date   CREATININE 0.78 03/15/2019   CREATININE 1.04 (H) 03/14/2019   CREATININE 0.74 03/13/2019     Physical Exam Vitals signs and nursing note reviewed.  Constitutional:      Appearance: Normal appearance.  HENT:     Head: Normocephalic and atraumatic.  Neck:     Musculoskeletal: Normal range of motion and neck supple.  Cardiovascular:     Rate and Rhythm: Normal rate and regular rhythm.  Pulmonary:     Effort: Pulmonary effort is normal. No respiratory distress.     Breath sounds: No wheezing or rales.  Abdominal:     General: There is no distension.     Palpations: Abdomen is soft.  Musculoskeletal:        General: No tenderness.     Right lower leg: No edema.     Left lower leg: No edema.  Skin:    General: Skin is warm and dry.  Neurological:     General: No focal deficit present.     Mental Status: She is alert and oriented to person, place, and time.  Psychiatric:        Mood and Affect: Mood normal.        Behavior: Behavior normal.        Thought Content: Thought content normal.     Assessment & Plan:  1: Chronic heart failure with reduced ejection fraction- - NYHA class II - euvolemic today - weighing daily and says that her weight has been stable; reminded to call for an overnight weight gain of >2 pounds or a weekly weight gain of >5 pounds - not adding salt and has been reading food labels to keep daily sodium intake to 1500mg  / day; written dietary information given to patient - patient says that she gets light-headed and has tightness/pain around her ribs/ back after she takes lisinopril and also has a dry cough; will stop the lisinopril and resume carvedilol 3.125mg  BID - will also decrease her furosemide to 40mg  AM and additional 40mg  PM PRN instead of BID - saw cardiology Clayborn Bigness) 03/21/2019 - BNP 03/11/2019 was  381.0 - has received her flu vaccine for this season  2: HTN- - BP had dropped when metoprolol/ lisinopril was tried during recent admission; lisinopril & carvedilol both tried at most recent cardiology visit with resultant hypotension again - saw PCP 03/26/2019 who decreased lisinopril and patient stopped carvedilol on her own - BMP 03/15/2019 reviewed and showed sodium 138, potassium 3.9,. creatinine 0.78 and GFR >60  Medication bottles were reviewed.  Return in 2 weeks or sooner for any questions/problems before then. Should her tightness/pain/cough improve off of the lisinopril, will add this as an allergy

## 2019-03-28 ENCOUNTER — Other Ambulatory Visit: Payer: Self-pay

## 2019-03-28 ENCOUNTER — Encounter: Payer: Self-pay | Admitting: Family

## 2019-03-28 ENCOUNTER — Ambulatory Visit: Payer: Self-pay | Attending: Family | Admitting: Family

## 2019-03-28 VITALS — BP 148/95 | HR 96 | Resp 22 | Ht 66.0 in | Wt 287.6 lb

## 2019-03-28 DIAGNOSIS — I110 Hypertensive heart disease with heart failure: Secondary | ICD-10-CM | POA: Insufficient documentation

## 2019-03-28 DIAGNOSIS — R7303 Prediabetes: Secondary | ICD-10-CM | POA: Insufficient documentation

## 2019-03-28 DIAGNOSIS — E669 Obesity, unspecified: Secondary | ICD-10-CM | POA: Insufficient documentation

## 2019-03-28 DIAGNOSIS — Z79899 Other long term (current) drug therapy: Secondary | ICD-10-CM | POA: Insufficient documentation

## 2019-03-28 DIAGNOSIS — Z886 Allergy status to analgesic agent status: Secondary | ICD-10-CM | POA: Insufficient documentation

## 2019-03-28 DIAGNOSIS — Z6841 Body Mass Index (BMI) 40.0 and over, adult: Secondary | ICD-10-CM | POA: Insufficient documentation

## 2019-03-28 DIAGNOSIS — I10 Essential (primary) hypertension: Secondary | ICD-10-CM

## 2019-03-28 DIAGNOSIS — I5022 Chronic systolic (congestive) heart failure: Secondary | ICD-10-CM | POA: Insufficient documentation

## 2019-03-28 NOTE — Patient Instructions (Addendum)
Continue weighing daily and call for an overnight weight gain of > 2 pounds or a weekly weight gain of >5 pounds.   Stop taking lisinopril and tomorrow begin taking carvedilol (coreg) as 1 tablet twice daily.   Also take your furosemide (lasix) in the morning and take a second dose if needed in the afternoon for above weight gain, swelling or shortness of breath

## 2019-04-01 ENCOUNTER — Telehealth: Payer: Self-pay | Admitting: Family

## 2019-04-01 ENCOUNTER — Other Ambulatory Visit: Payer: Self-pay | Admitting: Internal Medicine

## 2019-04-01 DIAGNOSIS — I509 Heart failure, unspecified: Secondary | ICD-10-CM

## 2019-04-01 MED ORDER — SACUBITRIL-VALSARTAN 24-26 MG PO TABS: 1.0000 | ORAL_TABLET | Freq: Two times a day (BID) | ORAL | 3 refills | Status: DC

## 2019-04-01 NOTE — Telephone Encounter (Signed)
Patient called to say that her BP is starting to rise. When she was here last we stopped her lisinopril due to reported hypotension and a dry cough. She says that the cough has improved although she's coughing some today. Weight is stable. Most recent BP today was 148/95 and the upper number has been in the 150's.   Discussed entresto and she is willing to try that. Will start entresto 24/26mg  BID. Prescription sent to [facility] on [address], per her request. Emphasized that she needed to keep an eye on her BP. Should her cough worsen, she is to contact her PCP.   Will see patient as scheduled on 04/14/2019

## 2019-04-11 NOTE — Progress Notes (Signed)
Patient ID: Pamela Kramer, female    DOB: 10-Jan-1956, 63 y.o.   MRN: [id number]  HPI  Pamela Kramer is a 63 y/o female with a history of hyperlipidemia, HTN, obesity and heart failure.   Echo report from 03/12/2019 which showed an EF of 35-40% along with mild MR/TR.  Admitted 03/11/2019 due to acute heart failure. Initially treated with IV lasix and then transitioned to oral diuretics. Tried metoprolol and lisinopril with resultant hypotension. Discharged after 4 days.   She presents today for a follow-up visit with a chief complaint of minimal shortness of breath upon moderate exertion. She describes this as chronic in nature having been present for several months. She has associated back tightness at times and snoring. She denies any difficulty in sleeping, dizziness, abdominal distention, palpitations, pedal edema, chest pain, cough, fatigue or weight gain.   Home weight has been stable except for this morning and it went up a little over 2 pounds. She says that she "cheated" and had 2 glasses of wine and some dietary indiscretion.  Past Medical History:  Diagnosis Date  . CHF (congestive heart failure) (HCC)   . Hyperlipidemia   . Hypertension   . Pre-diabetes    Past Surgical History:  Procedure Laterality Date  . TUBAL LIGATION     No family history on file. Social History   Tobacco Use  . Smoking status: Never Smoker  . Smokeless tobacco: Never Used  Substance Use Topics  . Alcohol use: Yes    Alcohol/week: 1.0 standard drinks    Types: 1 Glasses of wine per week   Allergies  Allergen Reactions  . Excedrin Migraine  [Aspirin-Acetaminophen-Caffeine] Anaphylaxis   Prior to Admission medications   Medication Sig Start Date End Date Taking? Authorizing Provider  Ascorbic Acid (VITAMIN C WITH ROSE HIPS) 1000 MG tablet Take 1,000 mg by mouth 3 (three) times daily.   Yes [provider]  carvedilol (COREG) 3.125 MG tablet Take 3.125 mg by mouth 2 (two) times daily  with a meal.   Yes [provider]  furosemide (LASIX) 40 MG tablet Take 1 tablet (40 mg total) by mouth 2 (two) times daily. Patient taking differently: Take 40 mg by mouth daily. Take 2nd dose in the afternoon if needed 03/15/19  Yes Mody, Sital, MD  ipratropium-albuterol (DUONEB) 0.5-2.5 (3) MG/3ML SOLN Take 3 mLs by nebulization 3 (three) times daily.   Yes [provider]  omeprazole (PRILOSEC) 20 MG capsule Take 20 mg by mouth 2 (two) times daily.   Yes [provider]  potassium chloride (KLOR-CON) 10 MEQ tablet Take 1 tablet (10 mEq total) by mouth daily. 03/15/19  Yes Mody, Sital, MD  sacubitril-valsartan (ENTRESTO) 24-26 MG Take 1 tablet by mouth 2 (two) times daily. 04/01/19  Yes Delma Freeze, FNP     Review of Systems  Constitutional: Negative for appetite change and fatigue.  HENT: Negative for congestion, postnasal drip and sore throat.   Eyes: Negative.   Respiratory: Positive for shortness of breath (minimal). Negative for cough and chest tightness.        + snoring  Cardiovascular: Negative for chest pain, palpitations and leg swelling.  Gastrointestinal: Negative for abdominal distention and abdominal pain.  Endocrine: Negative.   Genitourinary: Negative.  Decreased urine volume:    Musculoskeletal: Positive for back pain (tightness in back at times). Negative for neck pain.  Skin: Negative.   Allergic/Immunologic: Negative.   Neurological: Negative for dizziness and light-headedness.  Hematological: Negative for  adenopathy. Does not bruise/bleed easily.  Psychiatric/Behavioral: Negative for dysphoric mood and sleep disturbance (sleeping on 2 pillows). The patient is not nervous/anxious.    Vitals:   04/14/19 0823  BP: 101/82  Pulse: 98  Resp: 18  SpO2: 99%  Weight: 290 lb 6.4 oz (131.7 kg)  Height: 5\' 6"  (1.676 m)   Wt Readings from Last 3 Encounters:  04/14/19 290 lb 6.4 oz (131.7 kg)  03/28/19 287 lb 9.6 oz (130.5 kg)  03/15/19  294 lb 3.2 oz (133.4 kg)   Lab Results  Component Value Date   CREATININE 0.78 03/15/2019   CREATININE 1.04 (H) 03/14/2019   CREATININE 0.74 03/13/2019     Physical Exam Vitals signs and nursing note reviewed.  Constitutional:      Appearance: Normal appearance.  HENT:     Head: Normocephalic and atraumatic.  Neck:     Musculoskeletal: Normal range of motion and neck supple.  Cardiovascular:     Rate and Rhythm: Normal rate and regular rhythm.  Pulmonary:     Effort: Pulmonary effort is normal. No respiratory distress.     Breath sounds: No wheezing or rales.  Abdominal:     General: There is no distension.     Palpations: Abdomen is soft.  Musculoskeletal:        General: No tenderness.     Right lower leg: No edema.     Left lower leg: No edema.  Skin:    General: Skin is warm and dry.  Neurological:     General: No focal deficit present.     Mental Status: She is alert and oriented to person, place, and time.  Psychiatric:        Mood and Affect: Mood normal.        Behavior: Behavior normal.        Thought Content: Thought content normal.     Assessment & Plan:  1: Chronic heart failure with reduced ejection fraction- - NYHA class II - euvolemic today - weighing daily and says that her weight has been stable until this morning; reminded to call for an overnight weight gain of >2 pounds or a weekly weight gain of >5 pounds - weight up ~ 3 pounds from last visit 2 weeks ago but she says that she had dietary slip-ups yesterday; home weight chart reviewed and discussed the weight gain and correlation with diet - not adding salt and has been reading food labels to keep daily sodium intake to 1500mg  / day - entresto 24/26mg  BID was started 2 weeks ago and home BP's look good although she does have some low ones - 56 samples of entresto given to patient and information on [facility] was given to her; advised her to call them today to get the  paperwork started, otherwise, we may need to fill out [employer] paperwork - will get BMP today - she has been taking furosemide BID still so instructed her to take it in the mornings and only take the 2nd dose if she needs it - saw cardiology Pamela Bigness) 03/21/2019 - BNP 03/11/2019 was 381.0 - has received her flu vaccine for this season  2: HTN- - BP looks good today - saw PCP 03/26/2019 and she returns on 04/18/2019 - BMP 03/15/2019 reviewed and showed sodium 138, potassium 3.9,. creatinine 0.78 and GFR >60  3: Snoring- - saw pulmonology Raul Del) 04/03/2019 & returns in 3 months.  - home sleep study ordered by him  Patient did not bring her  medications nor a list. Each medication was verbally reviewed with the patient and she was encouraged to bring the bottles to every visit to confirm accuracy of list. She says that she is going take her omeprazole PRN and use her nebulizer PRN.   Return in 1 month or sooner for any questions/problems before then.

## 2019-04-14 ENCOUNTER — Encounter: Payer: Self-pay | Admitting: Family

## 2019-04-14 ENCOUNTER — Ambulatory Visit: Payer: Self-pay | Attending: Family | Admitting: Family

## 2019-04-14 ENCOUNTER — Other Ambulatory Visit: Payer: Self-pay

## 2019-04-14 VITALS — BP 101/82 | HR 98 | Resp 18 | Ht 66.0 in | Wt 290.4 lb

## 2019-04-14 DIAGNOSIS — I10 Essential (primary) hypertension: Secondary | ICD-10-CM

## 2019-04-14 DIAGNOSIS — R7303 Prediabetes: Secondary | ICD-10-CM | POA: Insufficient documentation

## 2019-04-14 DIAGNOSIS — Z79899 Other long term (current) drug therapy: Secondary | ICD-10-CM | POA: Insufficient documentation

## 2019-04-14 DIAGNOSIS — E669 Obesity, unspecified: Secondary | ICD-10-CM | POA: Insufficient documentation

## 2019-04-14 DIAGNOSIS — R0683 Snoring: Secondary | ICD-10-CM | POA: Insufficient documentation

## 2019-04-14 DIAGNOSIS — Z886 Allergy status to analgesic agent status: Secondary | ICD-10-CM | POA: Insufficient documentation

## 2019-04-14 DIAGNOSIS — E785 Hyperlipidemia, unspecified: Secondary | ICD-10-CM | POA: Insufficient documentation

## 2019-04-14 DIAGNOSIS — I110 Hypertensive heart disease with heart failure: Secondary | ICD-10-CM | POA: Insufficient documentation

## 2019-04-14 DIAGNOSIS — I5022 Chronic systolic (congestive) heart failure: Secondary | ICD-10-CM | POA: Insufficient documentation

## 2019-04-14 LAB — BASIC METABOLIC PANEL
Anion gap: 13 (ref 5–15)
BUN: 11 mg/dL (ref 8–23)
CO2: 24 mmol/L (ref 22–32)
Calcium: 9.1 mg/dL (ref 8.9–10.3)
Chloride: 103 mmol/L (ref 98–111)
Creatinine, Ser: 0.78 mg/dL (ref 0.44–1.00)
GFR calc Af Amer: >60 mL/min (ref >60)
GFR calc non Af Amer: >60 mL/min (ref >60)
Glucose, Bld: 175 mg/dL — ABNORMAL HIGH (ref 70–99)
Potassium: 3.5 mmol/L (ref 3.5–5.1)
Sodium: 140 mmol/L (ref 135–145)

## 2019-04-14 NOTE — Patient Instructions (Addendum)
Continue weighing daily and call for an overnight weight gain of > 2 pounds or a weekly weight gain of >5 pounds.  Decrease furosemide to once daily and take a second dose if you need it for above weight gain, swelling or shortness of breath.

## 2019-04-18 ENCOUNTER — Encounter
Admission: RE | Admit: 2019-04-18 | Discharge: 2019-04-18 | Disposition: A | Discharge status: Home / Self Care | Payer: Self-pay | Source: Ambulatory Visit | Attending: Internal Medicine | Admitting: Internal Medicine

## 2019-04-18 ENCOUNTER — Other Ambulatory Visit: Payer: Self-pay

## 2019-04-18 DIAGNOSIS — I509 Heart failure, unspecified: Secondary | ICD-10-CM | POA: Insufficient documentation

## 2019-04-18 LAB — NM MYOCAR MULTI W/SPECT W/WALL MOTION / EF
Estimated workload: 1 METS
Exercise duration (min): 1 min
Exercise duration (sec): 2 s
LV dias vol: 185 mL (ref 46–106)
LV sys vol: 105 mL
MPHR: 157 {beats}/min
Peak HR: 94 {beats}/min
Percent HR: 59 %
Rest HR: 71 {beats}/min
SDS: 16
SRS: 10
SSS: 6
TID: 1.12

## 2019-04-18 MED ORDER — REGADENOSON 0.4 MG/5ML IV SOLN
0.4000 mg | Freq: Once | INTRAVENOUS | Status: AC
  Administered 2019-04-18: 0.4 mg via INTRAVENOUS

## 2019-04-18 MED ORDER — TECHNETIUM TC 99M TETROFOSMIN IV KIT
32.6100 | PACK | Freq: Once | INTRAVENOUS | Status: AC | PRN
  Administered 2019-04-18: 10:00:00 32.61 via INTRAVENOUS

## 2019-04-18 MED ORDER — TECHNETIUM TC 99M TETROFOSMIN IV KIT
11.0900 | PACK | Freq: Once | INTRAVENOUS | Status: AC | PRN
  Administered 2019-04-18: 11.09 via INTRAVENOUS

## 2019-05-01 ENCOUNTER — Encounter: Payer: Self-pay | Admitting: Family

## 2019-05-01 ENCOUNTER — Ambulatory Visit: Payer: Self-pay | Attending: Family | Admitting: Family

## 2019-05-01 ENCOUNTER — Other Ambulatory Visit: Payer: Self-pay

## 2019-05-01 VITALS — BP 135/78 | HR 82 | Resp 20 | Ht 66.0 in | Wt 286.4 lb

## 2019-05-01 DIAGNOSIS — R0683 Snoring: Secondary | ICD-10-CM | POA: Insufficient documentation

## 2019-05-01 DIAGNOSIS — R531 Weakness: Secondary | ICD-10-CM | POA: Insufficient documentation

## 2019-05-01 DIAGNOSIS — R29898 Other symptoms and signs involving the musculoskeletal system: Secondary | ICD-10-CM

## 2019-05-01 DIAGNOSIS — R7303 Prediabetes: Secondary | ICD-10-CM | POA: Insufficient documentation

## 2019-05-01 DIAGNOSIS — I5022 Chronic systolic (congestive) heart failure: Secondary | ICD-10-CM | POA: Insufficient documentation

## 2019-05-01 DIAGNOSIS — I10 Essential (primary) hypertension: Secondary | ICD-10-CM

## 2019-05-01 DIAGNOSIS — Z886 Allergy status to analgesic agent status: Secondary | ICD-10-CM | POA: Insufficient documentation

## 2019-05-01 DIAGNOSIS — I110 Hypertensive heart disease with heart failure: Secondary | ICD-10-CM | POA: Insufficient documentation

## 2019-05-01 DIAGNOSIS — E669 Obesity, unspecified: Secondary | ICD-10-CM | POA: Insufficient documentation

## 2019-05-01 DIAGNOSIS — Z79899 Other long term (current) drug therapy: Secondary | ICD-10-CM | POA: Insufficient documentation

## 2019-05-01 DIAGNOSIS — Z888 Allergy status to other drugs, medicaments and biological substances status: Secondary | ICD-10-CM | POA: Insufficient documentation

## 2019-05-01 DIAGNOSIS — Z9851 Tubal ligation status: Secondary | ICD-10-CM | POA: Insufficient documentation

## 2019-05-01 DIAGNOSIS — Z6841 Body Mass Index (BMI) 40.0 and over, adult: Secondary | ICD-10-CM | POA: Insufficient documentation

## 2019-05-01 NOTE — Progress Notes (Signed)
Patient ID: Pamela Kramer, female    DOB: 01/09/1956, 63 y.o.   MRN: [id number]  HPI  Ms Heatherington is a 63 y/o female with a history of hyperlipidemia, HTN, obesity and heart failure.   Echo report from 03/12/2019 which showed an EF of 35-40% along with mild MR/TR.  Stress test done 04/18/2019 showed a normal study with low risk and an EF of 55-65%.   Admitted 03/11/2019 due to acute heart failure. Initially treated with IV lasix and then transitioned to oral diuretics. Tried metoprolol and lisinopril with resultant hypotension. Discharged after 4 days.   She presents today for a follow-up visit with a chief complaint of minimal shortness of breath upon moderate exertion. She describes this as chronic in nature. She has associated back pain and left hand weakness along with this. She denies any difficulty sleeping, dizziness, abdominal distention, palpitations, pedal edema, chest pain, cough, fatigue or weight gain.   She says that on 04/23/2019 (~8 days ago), she woke up and didn't have any use of her left hand. She thought she may have slept on it wrong and started shaking it and moving it around. She subsequently had feeling and movement return in it although continues to have some weakness in the left hand. Her BP that morning was elevated at 158/101 but has since come down back to her more normal reading. She is thinking that she may had had a mini-stroke.   Past Medical History:  Diagnosis Date  . CHF (congestive heart failure) (HCC)   . Hyperlipidemia   . Hypertension   . Pre-diabetes    Past Surgical History:  Procedure Laterality Date  . TUBAL LIGATION     No family history on file. Social History   Tobacco Use  . Smoking status: Never Smoker  . Smokeless tobacco: Never Used  Substance Use Topics  . Alcohol use: Yes    Alcohol/week: 1.0 standard drinks    Types: 1 Glasses of wine per week   Allergies  Allergen Reactions  . Excedrin Migraine   [Aspirin-Acetaminophen-Caffeine] Anaphylaxis  . Lisinopril Cough   Prior to Admission medications   Medication Sig Start Date End Date Taking? Authorizing Provider  Ascorbic Acid (VITAMIN C WITH ROSE HIPS) 1000 MG tablet Take 1,000 mg by mouth 3 (three) times daily.   Yes [provider]  carvedilol (COREG) 3.125 MG tablet Take 3.125 mg by mouth 2 (two) times daily with a meal.   Yes [provider]  furosemide (LASIX) 40 MG tablet Take 1 tablet (40 mg total) by mouth 2 (two) times daily. 03/15/19  Yes Mody, Sital, MD  ipratropium-albuterol (DUONEB) 0.5-2.5 (3) MG/3ML SOLN Take 3 mLs by nebulization 3 (three) times daily.   Yes [provider]  omeprazole (PRILOSEC) 20 MG capsule Take 20 mg by mouth 2 (two) times daily.   Yes [provider]  potassium chloride (KLOR-CON) 10 MEQ tablet Take 1 tablet (10 mEq total) by mouth daily. 03/15/19  Yes Mody, Sital, MD  sacubitril-valsartan (ENTRESTO) 24-26 MG Take 1 tablet by mouth 2 (two) times daily. 04/01/19  Yes Delma Freeze, FNP     Review of Systems  Constitutional: Negative for appetite change and fatigue.  HENT: Negative for congestion, postnasal drip and sore throat.   Eyes: Negative.   Respiratory: Positive for shortness of breath (minimal). Negative for cough and chest tightness.        + snoring  Cardiovascular: Negative for chest pain, palpitations and leg swelling.  Gastrointestinal:  Negative for abdominal distention and abdominal pain.  Endocrine: Negative.   Genitourinary: Negative.  Decreased urine volume:    Musculoskeletal: Positive for back pain (tightness in back at times). Negative for neck pain.  Skin: Negative.   Allergic/Immunologic: Negative.   Neurological: Positive for weakness (left hand). Negative for dizziness and light-headedness.  Hematological: Negative for adenopathy. Does not bruise/bleed easily.  Psychiatric/Behavioral: Negative for dysphoric mood and sleep disturbance  (sleeping on 2 pillows). The patient is not nervous/anxious.     Vitals:   05/01/19 0958  BP: 135/78  Pulse: 82  Resp: 20  SpO2: 100%  Weight: 286 lb 6.4 oz (129.9 kg)  Height: 5\' 6"  (1.676 m)   Wt Readings from Last 3 Encounters:  05/01/19 286 lb 6.4 oz (129.9 kg)  04/14/19 290 lb 6.4 oz (131.7 kg)  03/28/19 287 lb 9.6 oz (130.5 kg)   Lab Results  Component Value Date   CREATININE 0.78 04/14/2019   CREATININE 0.78 03/15/2019   CREATININE 1.04 (H) 03/14/2019     Physical Exam Vitals signs and nursing note reviewed.  Constitutional:      Appearance: Normal appearance.  HENT:     Head: Normocephalic and atraumatic.  Neck:     Musculoskeletal: Normal range of motion and neck supple.  Cardiovascular:     Rate and Rhythm: Normal rate and regular rhythm.  Pulmonary:     Effort: Pulmonary effort is normal. No respiratory distress.     Breath sounds: No wheezing or rales.  Abdominal:     General: There is no distension.     Palpations: Abdomen is soft.  Musculoskeletal:        General: No tenderness.     Right lower leg: No edema.     Left lower leg: No edema.  Skin:    General: Skin is warm and dry.  Neurological:     Mental Status: She is alert and oriented to person, place, and time.     Motor: Weakness (left hand grip is slightly less than right hand) present.  Psychiatric:        Mood and Affect: Mood normal.        Behavior: Behavior normal.        Thought Content: Thought content normal.     Assessment & Plan:  1: Chronic heart failure with reduced ejection fraction- - NYHA class II - euvolemic today - weighing daily and says that her weight has declined some; reminded to call for an overnight weight gain of >2 pounds or a weekly weight gain of >5 pounds - weight down 4 pounds from last visit 2 weeks ago; has been trying to walk more and parks further away from store entrances - stress test showed an improved EF - not adding salt and had really limited  her sodium intake to ~ 1000mg  / day; says that she's been increasing that amount and is now ranging between 2000-2500mg  / day because she wasn't feeling good on that really sodium diet - has resumed taking furosemide BID as her BP went up when she reduced it to daily - saw cardiology Clayborn Bigness) 03/21/2019 - BNP 03/11/2019 was 381.0 - has received her flu vaccine for this season  2: HTN- - BP looks good today; home BP ranges from 95-110/ 50-90 except did have elevated one last week - saw PCP 03/26/2019; is planning to transfer her primary care  - BMP 04/14/2019 reviewed and showed sodium 140, potassium 3.5,. creatinine 0.78 and GFR >60  3:  Snoring- - saw pulmonology Meredeth Ide([place]) 04/03/2019 & returns in 3 months.  - home sleep study ordered by him  4: Left hand weakness- - continues to have some weakness in her left hand although it has improved since she increased her daily sodium intake from ~ 1000mg / day to 2000-2500mg  / day - encouraged her to follow-up with PCP regarding this  Medication list reviewed.   Return here in 3 months or sooner for any questions/problems before then.

## 2019-05-01 NOTE — Patient Instructions (Signed)
Continue weighing daily and call for an overnight weight gain of > 2 pounds or a weekly weight gain of >5 pounds. 

## 2019-05-14 ENCOUNTER — Ambulatory Visit: Payer: Self-pay | Admitting: Family

## 2019-07-30 ENCOUNTER — Ambulatory Visit: Payer: Self-pay | Admitting: Family

## 2019-08-11 ENCOUNTER — Ambulatory Visit: Payer: Self-pay | Admitting: Family

## 2019-08-23 NOTE — Progress Notes (Signed)
Patient ID: Pamela Kramer, female    DOB: 07/28/1955, 64 y.o.   MRN: [id number]  HPI  Ms Sandell is a 64 y/o female with a history of hyperlipidemia, HTN, obesity and heart failure.   Echo report from 03/12/2019 which showed an EF of 35-40% along with mild MR/TR.  Stress test done 04/18/2019 showed a normal study with low risk and an EF of 55-65%.   Admitted 03/11/2019 due to acute heart failure. Initially treated with IV lasix and then transitioned to oral diuretics. Tried metoprolol and lisinopril with resultant hypotension. Discharged after 4 days.   She presents today for a follow-up visit with a chief complaint of minimal shortness of breath upon moderate exertion. She describes this as chronic in nature having been present for several years. She has associated wheezing, intermittent back pain, increased stress and gradual weight gain along with this. She denies any difficulty sleeping, dizziness, abdominal distention, palpitations, pedal edema, chest pain, cough or fatigue.   Says that she's had quite a few stressors over this year. Son and DIL both died, mom had been sick and her husband had half his foot amputated so everything fell on her to do. She says that during this time, she wasn't exercising nor was she watching her diet. Things are settling down so she's recently started exercising and watching her diet.    Past Medical History:  Diagnosis Date  . CHF (congestive heart failure) (HCC)   . Hyperlipidemia   . Hypertension   . Pre-diabetes    Past Surgical History:  Procedure Laterality Date  . TUBAL LIGATION     No family history on file. Social History   Tobacco Use  . Smoking status: Never Smoker  . Smokeless tobacco: Never Used  Substance Use Topics  . Alcohol use: Yes    Alcohol/week: 1.0 standard drinks    Types: 1 Glasses of wine per week   Allergies  Allergen Reactions  . Excedrin Migraine  [Aspirin-Acetaminophen-Caffeine] Anaphylaxis  . Lisinopril  Cough    Prior to Admission medications   Medication Sig Start Date End Date Taking? Authorizing Provider  Ascorbic Acid (VITAMIN C WITH ROSE HIPS) 1000 MG tablet Take 1,000 mg by mouth 3 (three) times daily.   Yes [provider]  carvedilol (COREG) 3.125 MG tablet Take 3.125 mg by mouth 2 (two) times daily with a meal.   Yes [provider]  furosemide (LASIX) 40 MG tablet Take 1 tablet (40 mg total) by mouth 2 (two) times daily. 03/15/19  Yes Mody, Sital, MD  ipratropium-albuterol (DUONEB) 0.5-2.5 (3) MG/3ML SOLN Take 3 mLs by nebulization 3 (three) times daily.   Yes [provider]  omeprazole (PRILOSEC) 20 MG capsule Take 20 mg by mouth 2 (two) times daily.   Yes [provider]  potassium chloride (KLOR-CON) 10 MEQ tablet Take 1 tablet (10 mEq total) by mouth daily. 03/15/19  Yes Mody, Sital, MD  sacubitril-valsartan (ENTRESTO) 24-26 MG Take 1 tablet by mouth 2 (two) times daily. 04/01/19  Yes Delma Freeze, FNP     Review of Systems  Constitutional: Negative for appetite change and fatigue.  HENT: Negative for congestion, postnasal drip and sore throat.   Eyes: Negative.   Respiratory: Positive for shortness of breath (minimal) and wheezing. Negative for cough and chest tightness.        + snoring  Cardiovascular: Negative for chest pain, palpitations and leg swelling.  Gastrointestinal: Negative for abdominal distention and abdominal pain.  Endocrine: Negative.  Genitourinary: Negative.  Negative for decreased urine volume ( ).  Musculoskeletal: Positive for back pain (tightness in back at times). Negative for neck pain.  Skin: Negative.   Allergic/Immunologic: Negative.   Neurological: Negative for dizziness, weakness and light-headedness.  Hematological: Negative for adenopathy. Does not bruise/bleed easily.  Psychiatric/Behavioral: Negative for dysphoric mood and sleep disturbance (sleeping on 1 pillow). The patient is not  nervous/anxious.    Vitals:   08/25/19 0931 08/25/19 0943  BP: (!) 163/97 130/80  Pulse: 71   Resp: 18   SpO2: 98%   Weight: 296 lb 4 oz (134.4 kg)   Height: 5\' 4"  (1.626 m)    Wt Readings from Last 3 Encounters:  08/25/19 296 lb 4 oz (134.4 kg)  05/01/19 286 lb 6.4 oz (129.9 kg)  04/14/19 290 lb 6.4 oz (131.7 kg)   Lab Results  Component Value Date   CREATININE 0.78 04/14/2019   CREATININE 0.78 03/15/2019   CREATININE 1.04 (H) 03/14/2019    Physical Exam Vitals and nursing note reviewed.  Constitutional:      Appearance: Normal appearance.  HENT:     Head: Normocephalic and atraumatic.  Cardiovascular:     Rate and Rhythm: Normal rate and regular rhythm.  Pulmonary:     Effort: Pulmonary effort is normal. No respiratory distress.     Breath sounds: Examination of the right-upper field reveals wheezing. Examination of the left-upper field reveals wheezing. Examination of the right-lower field reveals wheezing. Examination of the left-lower field reveals wheezing. Wheezing present. No rales.  Abdominal:     General: There is no distension.     Palpations: Abdomen is soft.  Musculoskeletal:        General: No tenderness.     Cervical back: Normal range of motion and neck supple.     Right lower leg: No edema.     Left lower leg: No edema.  Skin:    General: Skin is warm and dry.  Neurological:     Mental Status: She is alert and oriented to person, place, and time.  Psychiatric:        Mood and Affect: Mood normal.        Behavior: Behavior normal.        Thought Content: Thought content normal.     Assessment & Plan:  1: Chronic heart failure with reduced ejection fraction- - NYHA class II - euvolemic today - weighing daily and says that her weight has gradually risen due to stressors; reminded to call for an overnight weight gain of >2 pounds or a weekly weight gain of >5 pounds - weight up 10 pounds from last visit 4 months ago - has started exercising  again and watching her sodium intake - not adding salt but had been eating more fast food due to taking her husband to twice weekly appointments - will increase her carvedilol to 6.25mg  BID; can finish her current dose by taking 2 tablets BID until gone and then begin the 6.25mg  dose - consider titrating up entresto at her next visit (she does get entresto through [employer] patient assistance program) - saw cardiology Clayborn Bigness) 03/21/2019 - BNP 03/11/2019 was 381.0 - has received her flu vaccine for this season  2: HTN- - BP initially elevated after walking to the office (163/97) but had improved upon recheck (130/80) - saw PCP 03/26/2019; has appt with previous PCP Ouida Sills) in [place] on 09/04/19 - BMP 04/14/2019 reviewed and showed sodium 140, potassium 3.5,. creatinine 0.78 and GFR >60  3: Snoring- - saw pulmonology Meredeth Ide) 07/16/19 - not waking herself up anymore so did not have sleep study done    Patient did not bring her medications nor a list. Each medication was verbally reviewed with the patient and she was encouraged to bring the bottles to every visit to confirm accuracy of list.   Return in 1 month or sooner for any questions/problems before then.

## 2019-08-25 ENCOUNTER — Encounter: Payer: Self-pay | Admitting: Family

## 2019-08-25 ENCOUNTER — Ambulatory Visit: Payer: Self-pay | Attending: Family | Admitting: Family

## 2019-08-25 ENCOUNTER — Other Ambulatory Visit: Payer: Self-pay

## 2019-08-25 VITALS — BP 130/80 | HR 71 | Resp 18 | Ht 64.0 in | Wt 296.2 lb

## 2019-08-25 DIAGNOSIS — E669 Obesity, unspecified: Secondary | ICD-10-CM | POA: Insufficient documentation

## 2019-08-25 DIAGNOSIS — M549 Dorsalgia, unspecified: Secondary | ICD-10-CM | POA: Insufficient documentation

## 2019-08-25 DIAGNOSIS — Z79899 Other long term (current) drug therapy: Secondary | ICD-10-CM | POA: Insufficient documentation

## 2019-08-25 DIAGNOSIS — R7303 Prediabetes: Secondary | ICD-10-CM | POA: Insufficient documentation

## 2019-08-25 DIAGNOSIS — R0602 Shortness of breath: Secondary | ICD-10-CM | POA: Insufficient documentation

## 2019-08-25 DIAGNOSIS — E785 Hyperlipidemia, unspecified: Secondary | ICD-10-CM | POA: Insufficient documentation

## 2019-08-25 DIAGNOSIS — I5022 Chronic systolic (congestive) heart failure: Secondary | ICD-10-CM | POA: Insufficient documentation

## 2019-08-25 DIAGNOSIS — I110 Hypertensive heart disease with heart failure: Secondary | ICD-10-CM | POA: Insufficient documentation

## 2019-08-25 DIAGNOSIS — R062 Wheezing: Secondary | ICD-10-CM | POA: Insufficient documentation

## 2019-08-25 DIAGNOSIS — Z7901 Long term (current) use of anticoagulants: Secondary | ICD-10-CM | POA: Insufficient documentation

## 2019-08-25 DIAGNOSIS — I10 Essential (primary) hypertension: Secondary | ICD-10-CM

## 2019-08-25 DIAGNOSIS — R0683 Snoring: Secondary | ICD-10-CM | POA: Insufficient documentation

## 2019-08-25 MED ORDER — CARVEDILOL 6.25 MG PO TABS: 6.2500 mg | ORAL_TABLET | Freq: Two times a day (BID) | ORAL | 3 refills | Status: DC

## 2019-08-25 NOTE — Patient Instructions (Addendum)
Continue weighing daily and call for an overnight weight gain of > 2 pounds or a weekly weight gain of >5 pounds.   Finish your current carvedilol dose by taking 2 tablets twice daily until gone. When you pick up the new prescription of 6.25mg , you will then take 1 tablet twice daily.   Double check the dose you pick up from [facility] to make sure you get the 6.25mg  dose

## 2019-09-24 NOTE — Progress Notes (Signed)
Patient ID: Pamela Kramer, female    DOB: [birth year], 64 y.o.   MRN: [id number]  HPI  Ms Walth is a 64 y/o female with a history of hyperlipidemia, HTN, obesity and heart failure.   Echo report from 03/12/2019 which showed an EF of 35-40% along with mild MR/TR.  Stress test done 04/18/2019 showed a normal study with low risk and an EF of 55-65%.   Has not been admitted or been in the ED in the last 6 months.    She presents today for a follow-up visit with a chief complaint of intermittent back pain. She describes this as chronic in nature having been present for several years. She has associated wheezing along with this. She denies any difficulty sleeping, dizziness, abdominal distention, palpitations, pedal edema, chest pain, shortness of breath, cough, fatigue or weight gain.   Re-established with her PCP who has recently started her on metformin XR and atorvastatin. Does not have a glucometer yet.   Brings home BP readings and weight chart in for review. Weight has declined a few pounds and BP ranges from 90-138/49-89. She says that her BID furosemide is interfering with her job due to frequent urination.   Past Medical History:  Diagnosis Date  . CHF (congestive heart failure) ([facility])   . Hyperlipidemia   . Hypertension   . Pre-diabetes    Past Surgical History:  Procedure Laterality Date  . TUBAL LIGATION     No family history on file. Social History   Tobacco Use  . Smoking status: Never Smoker  . Smokeless tobacco: Never Used  Substance Use Topics  . Alcohol use: Yes    Alcohol/week: 1.0 standard drinks    Types: 1 Glasses of wine per week   Allergies  Allergen Reactions  . Excedrin Migraine  [Aspirin-Acetaminophen-Caffeine] Anaphylaxis  . Lisinopril Cough   Prior to Admission medications   Medication Sig Start Date End Date Taking? Authorizing Provider  Ascorbic Acid (VITAMIN C WITH ROSE HIPS) 1000 MG tablet Take 1,000 mg by mouth in the morning and at  bedtime.    Yes [provider]  atorvastatin (LIPITOR) 20 MG tablet Take 20 mg by mouth daily.   Yes [provider]  carvedilol (COREG) 6.25 MG tablet Take 1 tablet (6.25 mg total) by mouth 2 (two) times daily. 08/25/19  Yes Darylene Price A, FNP  furosemide (LASIX) 40 MG tablet Take 1 tablet (40 mg total) by mouth 2 (two) times daily. 03/15/19  Yes Mody, Ulice Bold, MD  metFORMIN (GLUCOPHAGE-XR) 500 MG 24 hr tablet Take 500 mg by mouth daily with breakfast.   Yes [provider]  omeprazole (PRILOSEC) 20 MG capsule Take 20 mg by mouth as needed (acid reflux).    Yes [provider]  potassium chloride (KLOR-CON) 10 MEQ tablet Take 1 tablet (10 mEq total) by mouth daily. 03/15/19  Yes Mody, Sital, MD  sacubitril-valsartan (ENTRESTO) 24-26 MG Take 1 tablet by mouth 2 (two) times daily. 04/01/19  Yes Keyion Knack, Otila Kluver A, FNP  vitamin k 100 MCG tablet Take 100 mcg by mouth daily.   Yes [provider]  ipratropium-albuterol (DUONEB) 0.5-2.5 (3) MG/3ML SOLN Take 3 mLs by nebulization 3 (three) times daily.    [provider]     Review of Systems  Constitutional: Negative for appetite change and fatigue.  HENT: Negative for congestion, postnasal drip and sore throat.   Eyes: Negative.   Respiratory: Positive for wheezing. Negative for cough, chest tightness and shortness of  breath.        + snoring  Cardiovascular: Negative for chest pain, palpitations and leg swelling.  Gastrointestinal: Negative for abdominal distention and abdominal pain.  Endocrine: Negative.   Musculoskeletal: Positive for back pain (tightness in back at times). Negative for neck pain.  Skin: Negative.   Allergic/Immunologic: Negative.   Neurological: Negative for dizziness, weakness and light-headedness.  Hematological: Negative for adenopathy. Does not bruise/bleed easily.  Psychiatric/Behavioral: Negative for dysphoric mood and sleep disturbance (sleeping on 1 pillow). The  patient is not nervous/anxious.    Vitals:   09/25/19 0924  BP: (!) 141/83  Pulse: 76  Resp: 20  SpO2: 98%  Weight: 294 lb (133.4 kg)  Height: 5\' 6"  (1.676 m)   Wt Readings from Last 3 Encounters:  09/25/19 294 lb (133.4 kg)  08/25/19 296 lb 4 oz (134.4 kg)  05/01/19 286 lb 6.4 oz (129.9 kg)   Lab Results  Component Value Date   CREATININE 0.78 04/14/2019   CREATININE 0.78 03/15/2019   CREATININE 1.04 (H) 03/14/2019     Physical Exam Vitals and nursing note reviewed.  Constitutional:      Appearance: Normal appearance.  HENT:     Head: Normocephalic and atraumatic.  Cardiovascular:     Rate and Rhythm: Normal rate and regular rhythm.  Pulmonary:     Effort: Pulmonary effort is normal. No respiratory distress.     Breath sounds: No wheezing or rales.  Abdominal:     General: There is no distension.     Palpations: Abdomen is soft.  Musculoskeletal:        General: No tenderness.     Cervical back: Normal range of motion and neck supple.     Right lower leg: No edema.     Left lower leg: No edema.  Skin:    General: Skin is warm and dry.  Neurological:     Mental Status: She is alert and oriented to person, place, and time.  Psychiatric:        Mood and Affect: Mood normal.        Behavior: Behavior normal.        Thought Content: Thought content normal.     Assessment & Plan:  1: Chronic heart failure with reduced ejection fraction- - NYHA class II - euvolemic today - weighing daily and says that her weight has stabilized; reminded to call for an overnight weight gain of >2 pounds or a weekly weight gain of >5 pounds - weight down 2 pounds from last visit 1 month ago - has started exercising again; parking further away from the door when going places - not adding salt but and trying to be mindful of sodium intake - carvedilol increased to 6.25mg  BID at her last visit - home BP readings on the low side so will decrease her furosemide to 40mg  daily; can  take an additional 40mg  if needed for above weight gain, edema or shortness of breath - should BP readings improve, would like to titrate entresto (gets it through [employer] patient assistance program) - saw cardiology 03/16/2019) 03/21/2019 - BNP 03/11/2019 was 381.0 - PharmD reconciled medications with the patient  2: HTN- - BP mildly elevated her but home readings have been low/ normal - saw PCP Juliann Pares) in [place] on 09/04/19 - BMP 09/10/19 reviewed and showed sodium 143, potassium 4.1,. creatinine 0.77 and GFR 82  3: Snoring- - saw pulmonology [facility]) 07/16/19 - not waking herself up anymore so did not have sleep study  done  4: Diabetes- - nonfasting glucose here today was 161 (ate 3 lightly salted ritz crackers before coming) - A1c 09/10/19 was 6.4% - encouraged her to get glucometer and check her glucose daily for a few weeks, sometimes fasting and other times nonfasting   Medication bottles were reviewed.   Return in 1 month or sooner for any questions/problems before then.

## 2019-09-25 ENCOUNTER — Encounter: Payer: Self-pay | Admitting: Family

## 2019-09-25 ENCOUNTER — Ambulatory Visit: Payer: Self-pay | Attending: Family | Admitting: Family

## 2019-09-25 ENCOUNTER — Other Ambulatory Visit: Payer: Self-pay

## 2019-09-25 VITALS — BP 141/83 | HR 76 | Resp 20 | Ht 66.0 in | Wt 294.0 lb

## 2019-09-25 DIAGNOSIS — I5022 Chronic systolic (congestive) heart failure: Secondary | ICD-10-CM | POA: Insufficient documentation

## 2019-09-25 DIAGNOSIS — R0683 Snoring: Secondary | ICD-10-CM | POA: Insufficient documentation

## 2019-09-25 DIAGNOSIS — E785 Hyperlipidemia, unspecified: Secondary | ICD-10-CM | POA: Insufficient documentation

## 2019-09-25 DIAGNOSIS — M549 Dorsalgia, unspecified: Secondary | ICD-10-CM | POA: Insufficient documentation

## 2019-09-25 DIAGNOSIS — Z79899 Other long term (current) drug therapy: Secondary | ICD-10-CM | POA: Insufficient documentation

## 2019-09-25 DIAGNOSIS — I110 Hypertensive heart disease with heart failure: Secondary | ICD-10-CM | POA: Insufficient documentation

## 2019-09-25 DIAGNOSIS — Z888 Allergy status to other drugs, medicaments and biological substances status: Secondary | ICD-10-CM | POA: Insufficient documentation

## 2019-09-25 DIAGNOSIS — Z886 Allergy status to analgesic agent status: Secondary | ICD-10-CM | POA: Insufficient documentation

## 2019-09-25 DIAGNOSIS — I10 Essential (primary) hypertension: Secondary | ICD-10-CM

## 2019-09-25 DIAGNOSIS — Z7984 Long term (current) use of oral hypoglycemic drugs: Secondary | ICD-10-CM | POA: Insufficient documentation

## 2019-09-25 DIAGNOSIS — E119 Type 2 diabetes mellitus without complications: Secondary | ICD-10-CM | POA: Insufficient documentation

## 2019-09-25 LAB — GLUCOSE, CAPILLARY: Glucose-Capillary: 161 mg/dL — ABNORMAL HIGH (ref 70–99)

## 2019-09-25 MED ORDER — FUROSEMIDE 40 MG PO TABS: 40.0000 mg | ORAL_TABLET | Freq: Two times a day (BID) | ORAL | 3 refills | Status: DC

## 2019-09-25 MED ORDER — POTASSIUM CHLORIDE ER 10 MEQ PO TBCR: 10.0000 meq | EXTENDED_RELEASE_TABLET | Freq: Every day | ORAL | 3 refills | Status: DC

## 2019-09-25 NOTE — Patient Instructions (Addendum)
Continue weighing daily and call for an overnight weight gain of > 2 pounds or a weekly weight gain of >5 pounds.   Decrease furosemide (lasix) to 40mg  daily. Can take an additional dose if needed for above weight gain, swelling or shortness of breath.

## 2019-10-26 NOTE — Progress Notes (Deleted)
Patient ID: Pamela Kramer, female    DOB: Mar 01, 1956, 64 y.o.   MRN: [id number]  HPI  Pamela Kramer is a 64 y/o female with a history of hyperlipidemia, HTN, obesity and heart failure.   Echo report from 03/12/2019 which showed an EF of 35-40% along with mild MR/TR.  Stress test done 04/18/2019 showed a normal study with low risk and an EF of 55-65%.   Has not been admitted or been in the ED in the last 6 months.    She presents today for a follow-up visit with a chief complaint of     Past Medical History:  Diagnosis Date  . CHF (congestive heart failure) (HCC)   . Hyperlipidemia   . Hypertension   . Pre-diabetes    Past Surgical History:  Procedure Laterality Date  . TUBAL LIGATION     No family history on file. Social History   Tobacco Use  . Smoking status: Never Smoker  . Smokeless tobacco: Never Used  Substance Use Topics  . Alcohol use: Yes    Alcohol/week: 1.0 standard drinks    Types: 1 Glasses of wine per week   Allergies  Allergen Reactions  . Excedrin Migraine  [Aspirin-Acetaminophen-Caffeine] Anaphylaxis  . Lisinopril Cough      Review of Systems  Constitutional: Negative for appetite change and fatigue.  HENT: Negative for congestion, postnasal drip and sore throat.   Eyes: Negative.   Respiratory: Positive for wheezing. Negative for cough, chest tightness and shortness of breath.        + snoring  Cardiovascular: Negative for chest pain, palpitations and leg swelling.  Gastrointestinal: Negative for abdominal distention and abdominal pain.  Endocrine: Negative.   Musculoskeletal: Positive for back pain (tightness in back at times). Negative for neck pain.  Skin: Negative.   Allergic/Immunologic: Negative.   Neurological: Negative for dizziness, weakness and light-headedness.  Hematological: Negative for adenopathy. Does not bruise/bleed easily.  Psychiatric/Behavioral: Negative for dysphoric mood and sleep disturbance (sleeping on 1 pillow).  The patient is not nervous/anxious.       Physical Exam Vitals and nursing note reviewed.  Constitutional:      Appearance: Normal appearance.  HENT:     Head: Normocephalic and atraumatic.  Cardiovascular:     Rate and Rhythm: Normal rate and regular rhythm.  Pulmonary:     Effort: Pulmonary effort is normal. No respiratory distress.     Breath sounds: No wheezing or rales.  Abdominal:     General: There is no distension.     Palpations: Abdomen is soft.  Musculoskeletal:        General: No tenderness.     Cervical back: Normal range of motion and neck supple.     Right lower leg: No edema.     Left lower leg: No edema.  Skin:    General: Skin is warm and dry.  Neurological:     Mental Status: She is alert and oriented to person, place, and time.  Psychiatric:        Mood and Affect: Mood normal.        Behavior: Behavior normal.        Thought Content: Thought content normal.     Assessment & Plan:  1: Chronic heart failure with reduced ejection fraction- - NYHA class II - euvolemic today - weighing daily and says that her weight has stabilized; reminded to call for an overnight weight gain of >2 pounds or a weekly weight gain of >5 pounds -  weight 294 pounds from last visit 1 month ago - has started exercising again; parking further away from the door when going places - not adding salt but and trying to be mindful of sodium intake - furosemide decreased to daily at last visit (was BID) - should BP readings improve, would like to titrate entresto (gets it through [employer] patient assistance program) - saw cardiology Clayborn Bigness) 03/21/2019 - BNP 03/11/2019 was 381.0 - PharmD reconciled medications with the patient  2: HTN- - BP  - saw PCP Ouida Sills) in [place] on 09/04/19 - BMP 09/10/19 reviewed and showed sodium 143, potassium 4.1,. creatinine 0.77 and GFR 82  3: Snoring- - saw pulmonology Raul Del) 07/16/19 - not waking herself up anymore so did not have sleep  study done  4: Diabetes- - nonfasting glucose here today was - A1c 09/10/19 was 6.4% - encouraged her to get glucometer and check her glucose daily for a few weeks, sometimes fasting and other times nonfasting   Medication bottles were reviewed.

## 2019-10-28 ENCOUNTER — Ambulatory Visit: Payer: Self-pay | Admitting: Family

## 2019-10-29 ENCOUNTER — Ambulatory Visit: Payer: Self-pay | Admitting: Family

## 2019-10-30 ENCOUNTER — Encounter: Payer: Self-pay | Admitting: Family

## 2019-10-30 ENCOUNTER — Other Ambulatory Visit: Payer: Self-pay

## 2019-10-30 ENCOUNTER — Ambulatory Visit: Payer: Self-pay | Attending: Family | Admitting: Family

## 2019-10-30 VITALS — BP 132/90 | HR 67 | Resp 18 | Ht 66.0 in | Wt 297.4 lb

## 2019-10-30 DIAGNOSIS — Z888 Allergy status to other drugs, medicaments and biological substances status: Secondary | ICD-10-CM | POA: Insufficient documentation

## 2019-10-30 DIAGNOSIS — I10 Essential (primary) hypertension: Secondary | ICD-10-CM

## 2019-10-30 DIAGNOSIS — Z886 Allergy status to analgesic agent status: Secondary | ICD-10-CM | POA: Insufficient documentation

## 2019-10-30 DIAGNOSIS — I5022 Chronic systolic (congestive) heart failure: Secondary | ICD-10-CM | POA: Insufficient documentation

## 2019-10-30 DIAGNOSIS — M549 Dorsalgia, unspecified: Secondary | ICD-10-CM | POA: Insufficient documentation

## 2019-10-30 DIAGNOSIS — E785 Hyperlipidemia, unspecified: Secondary | ICD-10-CM | POA: Insufficient documentation

## 2019-10-30 DIAGNOSIS — E119 Type 2 diabetes mellitus without complications: Secondary | ICD-10-CM | POA: Insufficient documentation

## 2019-10-30 DIAGNOSIS — R42 Dizziness and giddiness: Secondary | ICD-10-CM | POA: Insufficient documentation

## 2019-10-30 DIAGNOSIS — R0683 Snoring: Secondary | ICD-10-CM | POA: Insufficient documentation

## 2019-10-30 DIAGNOSIS — I110 Hypertensive heart disease with heart failure: Secondary | ICD-10-CM | POA: Insufficient documentation

## 2019-10-30 DIAGNOSIS — Z7984 Long term (current) use of oral hypoglycemic drugs: Secondary | ICD-10-CM | POA: Insufficient documentation

## 2019-10-30 DIAGNOSIS — Z79899 Other long term (current) drug therapy: Secondary | ICD-10-CM | POA: Insufficient documentation

## 2019-10-30 LAB — GLUCOSE, CAPILLARY: Glucose-Capillary: 125 mg/dL — ABNORMAL HIGH (ref 70–99)

## 2019-10-30 NOTE — Progress Notes (Signed)
Patient ID: Pamela Kramer, female    DOB: 1956/04/29, 64 y.o.   MRN: [id number]  HPI  Pamela Kramer is a 64 y/o female with a history of hyperlipidemia, HTN, obesity and heart failure.   Echo report from 03/12/2019 which showed an EF of 35-40% along with mild MR/TR.  Stress test done 04/18/2019 showed a normal study with low risk and an EF of 55-65%.   Has not been admitted or been in the ED in the last 6 months.    She presents today for a follow-up visit with a chief complaint of intermittent light-headedness. She says that this is sporadic in nature and isn't sure it correlates with her blood pressure. She has associated back pain at times along with this. She denies any difficulty sleeping, abdominal distention, palpitations, pedal edema, chest pain, wheezing, shortness of breath, cough, fatigue or weight gain.   She did resume taking her furosemide BID as she thought she was getting some swelling when it was reduced back to daily.    Past Medical History:  Diagnosis Date   CHF (congestive heart failure) (HCC)    Hyperlipidemia    Hypertension    Pre-diabetes    Past Surgical History:  Procedure Laterality Date   TUBAL LIGATION     No family history on file. Social History   Tobacco Use   Smoking status: Never Smoker   Smokeless tobacco: Never Used  Substance Use Topics   Alcohol use: Yes    Alcohol/week: 1.0 standard drinks    Types: 1 Glasses of wine per week   Allergies  Allergen Reactions   Excedrin Migraine  [Aspirin-Acetaminophen-Caffeine] Anaphylaxis   Lisinopril Cough   Prior to Admission medications   Medication Sig Start Date End Date Taking? Authorizing Provider  Ascorbic Acid (VITAMIN C WITH ROSE HIPS) 1000 MG tablet Take 1,000 mg by mouth in the morning and at bedtime.    Yes [provider]  atorvastatin (LIPITOR) 20 MG tablet Take 20 mg by mouth daily.   Yes [provider]  carvedilol (COREG) 6.25 MG tablet Take 1 tablet  (6.25 mg total) by mouth 2 (two) times daily. 08/25/19  Yes Darylene Price A, FNP  furosemide (LASIX) 40 MG tablet Take 1 tablet (40 mg total) by mouth 2 (two) times daily. 09/25/19  Yes Cheresa Siers, Otila Kluver A, FNP  metFORMIN (GLUCOPHAGE-XR) 500 MG 24 hr tablet Take 500 mg by mouth daily with breakfast.   Yes [provider]  omeprazole (PRILOSEC) 20 MG capsule Take 20 mg by mouth as needed (acid reflux).    Yes [provider]  potassium chloride (KLOR-CON) 10 MEQ tablet Take 1 tablet (10 mEq total) by mouth daily. 09/25/19  Yes Anea Fodera A, FNP  sacubitril-valsartan (ENTRESTO) 24-26 MG Take 1 tablet by mouth 2 (two) times daily. 04/01/19  Yes Ryver Poblete A, FNP  ipratropium-albuterol (DUONEB) 0.5-2.5 (3) MG/3ML SOLN Take 3 mLs by nebulization 3 (three) times daily.    [provider]    Review of Systems  Constitutional: Negative for appetite change and fatigue.  HENT: Negative for congestion, postnasal drip and sore throat.   Eyes: Negative.   Respiratory: Negative for cough, chest tightness, shortness of breath and wheezing.        + snoring  Cardiovascular: Negative for chest pain, palpitations and leg swelling.  Gastrointestinal: Negative for abdominal distention and abdominal pain.  Endocrine: Negative.   Musculoskeletal: Positive for back pain (tightness in back at times). Negative for neck pain.  Skin: Negative.   Allergic/Immunologic: Negative.   Neurological: Positive for light-headedness (at times). Negative for dizziness and weakness.  Hematological: Negative for adenopathy. Does not bruise/bleed easily.  Psychiatric/Behavioral: Negative for dysphoric mood and sleep disturbance (sleeping on 1 pillow). The patient is not nervous/anxious.    Vitals:   10/30/19 0827 10/30/19 0832 10/30/19 0834  BP: (!) 152/89 132/90 132/90  Pulse: 67    Resp: 18    SpO2: 98%    Weight: 297 lb 6 oz (134.9 kg)    Height: 5\' 6"  (1.676 m)     Wt Readings from Last 3  Encounters:  10/30/19 297 lb 6 oz (134.9 kg)  09/25/19 294 lb (133.4 kg)  08/25/19 296 lb 4 oz (134.4 kg)   Lab Results  Component Value Date   CREATININE 0.78 04/14/2019   CREATININE 0.78 03/15/2019   CREATININE 1.04 (H) 03/14/2019     Physical Exam Vitals and nursing note reviewed.  Constitutional:      Appearance: Normal appearance.  HENT:     Head: Normocephalic and atraumatic.  Cardiovascular:     Rate and Rhythm: Normal rate and regular rhythm.  Pulmonary:     Effort: Pulmonary effort is normal. No respiratory distress.     Breath sounds: No wheezing or rales.  Abdominal:     General: There is no distension.     Palpations: Abdomen is soft.  Musculoskeletal:        General: No tenderness.     Cervical back: Normal range of motion and neck supple.     Right lower leg: No edema.     Left lower leg: No edema.  Skin:    General: Skin is warm and dry.  Neurological:     Mental Status: She is alert and oriented to person, place, and time.  Psychiatric:        Mood and Affect: Mood normal.        Behavior: Behavior normal.        Thought Content: Thought content normal.     Assessment & Plan:  1: Chronic heart failure with reduced ejection fraction- - NYHA class I - euvolemic today - weighing daily and says that her weight has stabilized; reminded to call for an overnight weight gain of >2 pounds or a weekly weight gain of >5 pounds - weight up 3 pounds from last visit 1 month ago - continues to be active - not adding salt but and trying to be mindful of sodium intake - will increase her entresto to 49/51mg  BID; advised her to take 2 tablets BID of her current dose and a new prescription was sent to 03/16/2019 patient assistance program where she gets her entresto from (currently uninsured) - saw cardiology [facility]) 03/21/2019 - BNP 03/11/2019 was 381.0 - PharmD reconciled medications with the patient  2: HTN- - BP mildly elevated today (home BP readings  reviewed and do fluctuate)  - saw PCP 03/13/2019) in [place] on 09/04/19 - BMP 09/10/19 reviewed and showed sodium 143, potassium 4.1,. creatinine 0.77 and GFR 82  3: Snoring- - saw pulmonology 09/12/19) 07/16/19 - not waking herself up anymore so did not have sleep study done  4: Diabetes- - nonfasting glucose here today was 125 - A1c 09/10/19 was 6.4% - she is checking her glucose at home - could consider adding farxiga but would need patient assistance since she is uninsured   Medication bottles were reviewed.   Return in 5 weeks or sooner for any questions/problems before  then.

## 2019-10-30 NOTE — Patient Instructions (Addendum)
Continue weighing daily and call for an overnight weight gain of > 2 pounds or a weekly weight gain of >5 pounds.   Increase entresto to 2 tablets in the morning and 2 tablets in the evening

## 2019-10-30 NOTE — Progress Notes (Signed)
[facility] - PHARMACIST COUNSELING NOTE  ADHERENCE ASSESSMENT  Adherence strategy: Patient utilizes a pillbox at home.    Do you ever forget to take your medication? [] Yes (1) [x] No (0)  Do you ever skip doses due to side effects? [] Yes (1) [x] No (0)  Do you have trouble affording your medicines? [] Yes (1) [x] No (0)  Are you ever unable to pick up your medication due to transportation difficulties? [] Yes (1) [x] No (0)  Do you ever stop taking your medications because you don't believe they are helping? [] Yes (1) [x] No (0)  Total score 0    Recommendations given to patient about increasing adherence: None needed  Guideline-Directed Medical Therapy/Evidence Based Medicine  ACE/ARB/ARNI: Entresto 24-26 mg BID Beta Blocker: Carvedilol 6.25 mg BID Aldosterone Antagonist: None Diuretic: Furosemide 40 mg BID    SUBJECTIVE  HPI: Patient is a 64 y/o F with PMH as below who presents to HF clinic for follow-up.   Past Medical History:  Diagnosis Date  . CHF (congestive heart failure) ([place])   . Hyperlipidemia   . Hypertension   . Pre-diabetes       OBJECTIVE   Vital signs: HR 67, BP 132/90, weight (pounds) 297 ECHO: Date 03/12/19, EF 35-40%, notes: No LVH. Global RV mildly reduced systolic function, mildly increased RV thickness. LA + RA mildly dilated. Mild MR, mild TR, trivial AR Stress test: Date 04/18/19, EF 55-60%, notes: normal study  BMP Latest Ref Rng & Units 04/14/2019 03/15/2019 03/14/2019  Glucose 70 - 99 mg/dL 175(H) 140(H) 147(H)  BUN 8 - 23 mg/dL 11 21 20   Creatinine 0.44 - 1.00 mg/dL 0.78 0.78 1.04(H)  Sodium 135 - 145 mmol/L 140 138 137  Potassium 3.5 - 5.1 mmol/L 3.5 3.9 3.9  Chloride 98 - 111 mmol/L 103 98 102  CO2 22 - 32 mmol/L 24 29 31   Calcium 8.9 - 10.3 mg/dL 9.1 8.8(L) 8.8(L)    ASSESSMENT  Patient is well appearing and in no acute distress. She endorses taking medications daily as prescribed. Denies  missed doses. Reports she became a little dizzy after starting atorvastatin but symptoms have since resolved. She further endorses weight gain over the last couple of months after the passing of her son.   PLAN  1). CHF -GDMT with Entresto 24-26 mg BID, carvedilol 6.25 mg BID -Fluid management with furosemide 40 mg BID + potassium 10 mEq daily -Continue low sodium diet -Continue daily weights  -Discussed with provider - increase Entresto to 49-51 mg BID  2). Hypertension -Mildly hypertensive today in clinic -Checking BP QAM and QPM -Denies signs/symptoms of hypotension  3). Prediabetes -HgbA1c 6.4% on 09/10/19 which had increased from 6.1% on 03/11/19 -Metformin ER 500 mg daily -Checking BG QAM and before lunch -BG have been running mostly < 200 g/dL -Future consideration for addition of dapagliflozin for benefit in HF  4). Hyperlipidemia -Atorvastatin 20 mg daily -Lipid panel 09/10/19 with TG 114, HDL 52, LDL 144  Time spent: 25 minutes  [place] Resident 10/30/2019 8:31 AM    Current Outpatient Medications:  .  Ascorbic Acid (VITAMIN C WITH ROSE HIPS) 1000 MG tablet, Take 1,000 mg by mouth in the morning and at bedtime. , Disp: , Rfl:  .  atorvastatin (LIPITOR) 20 MG tablet, Take 20 mg by mouth daily., Disp: , Rfl:  .  carvedilol (COREG) 6.25 MG tablet, Take 1 tablet (6.25 mg total) by mouth 2 (two) times daily., Disp: 180 tablet, Rfl:  3 .  furosemide (LASIX) 40 MG tablet, Take 1 tablet (40 mg total) by mouth 2 (two) times daily. (Patient taking differently: Take 40 mg by mouth daily. ), Disp: 180 tablet, Rfl: 3 .  ipratropium-albuterol (DUONEB) 0.5-2.5 (3) MG/3ML SOLN, Take 3 mLs by nebulization 3 (three) times daily., Disp: , Rfl:  .  metFORMIN (GLUCOPHAGE-XR) 500 MG 24 hr tablet, Take 500 mg by mouth daily with breakfast., Disp: , Rfl:  .  omeprazole (PRILOSEC) 20 MG capsule, Take 20 mg by mouth as needed (acid reflux). , Disp: , Rfl:  .  potassium chloride  (KLOR-CON) 10 MEQ tablet, Take 1 tablet (10 mEq total) by mouth daily., Disp: 90 tablet, Rfl: 3 .  sacubitril-valsartan (ENTRESTO) 24-26 MG, Take 1 tablet by mouth 2 (two) times daily., Disp: 60 tablet, Rfl: 3 .  vitamin k 100 MCG tablet, Take 100 mcg by mouth daily., Disp: , Rfl:    COUNSELING POINTS/CLINICAL PEARLS  Carvedilol (Goal: weight less than 85 kg is 25 mg BID, weight greater than 85 kg is 50 mg BID)  Patient should avoid activities requiring coordination until drug effects are realized, as drug may cause dizziness.  This drug may cause diarrhea, nausea, vomiting, arthralgia, back pain, myalgia, headache, vision disorder, erectile dysfunction, reduced libido, or fatigue.  Instruct patient to report signs/symptoms of adverse cardiovascular effects such as hypotension (especially in elderly patients), arrhythmias, syncope, palpitations, angina, or edema.  Drug may mask symptoms of hypoglycemia. Advise diabetic patients to carefully monitor blood sugar levels.  Patient should take drug with food.  Advise patient against sudden discontinuation of drug. Entresto (Goal: 97/103 mg twice daily)  Warn female patient to avoid pregnancy during therapy and to report a pregnancy to a physician.  Advise patient to report symptomatic hypotension.  Side effects may include hyperkalemia, cough, dizziness, or renal failure. Furosemide  Drug causes sun-sensitivity. Advise patient to use sunscreen and avoid tanning beds. Patient should avoid activities requiring coordination until drug effects are realized, as drug may cause dizziness, vertigo, or blurred vision. This drug may cause hyperglycemia, hyperuricemia, constipation, diarrhea, loss of appetite, nausea, vomiting, purpuric disorder, cramps, spasticity, asthenia, headache, paresthesia, or scaling eczema. Instruct patient to report unusual bleeding/bruising or signs/symptoms of hypotension, infection, pancreatitis, or ototoxicity (tinnitus, hearing  impairment). Advise patient to report signs/symptoms of a severe skin reactions (flu-like symptoms, spreading red rash, or skin/mucous membrane blistering) or erythema multiforme. Instruct patient to eat high-potassium foods during drug therapy, as directed by healthcare professional.  Patient should not drink alcohol while taking this drug.  DRUGS TO AVOID IN HEART FAILURE  Drug or Class Mechanism  Analgesics . NSAIDs . COX-2 inhibitors . Glucocorticoids  Sodium and water retention, increased systemic vascular resistance, decreased response to diuretics   Diabetes Medications . Metformin . Thiazolidinediones o Rosiglitazone (Avandia) o Pioglitazone (Actos) . DPP4 Inhibitors o Saxagliptin (Onglyza) o Sitagliptin (Januvia)   Lactic acidosis Possible calcium channel blockade   Unknown  Antiarrhythmics . Class I  o Flecainide o Disopyramide . Class III o Sotalol . Other o Dronedarone  Negative inotrope, proarrhythmic   Proarrhythmic, beta blockade  Negative inotrope  Antihypertensives . Alpha Blockers o Doxazosin . Calcium Channel Blockers o Diltiazem o Verapamil o Nifedipine . Central Alpha Adrenergics o Moxonidine . Peripheral Vasodilators o Minoxidil  Increases renin and aldosterone  Negative inotrope    Possible sympathetic withdrawal  Unknown  Anti-infective . Itraconazole . Amphotericin B  Negative inotrope Unknown  Hematologic . Anagrelide . Cilostazol  Possible inhibition of PD IV Inhibition of PD III causing arrhythmias  Neurologic/Psychiatric . Stimulants . Anti-Seizure Drugs o Carbamazepine o Pregabalin . Antidepressants o Tricyclics o Citalopram . Parkinsons o Bromocriptine o Pergolide o Pramipexole . Antipsychotics o Clozapine . Antimigraine o Ergotamine o Methysergide . Appetite suppressants . Bipolar o Lithium  Peripheral alpha and beta agonist activity  Negative inotrope and chronotrope Calcium channel  blockade  Negative inotrope, proarrhythmic Dose-dependent QT prolongation  Excessive serotonin activity/valvular damage Excessive serotonin activity/valvular damage Unknown  IgE mediated hypersensitivy, calcium channel blockade  Excessive serotonin activity/valvular damage Excessive serotonin activity/valvular damage Valvular damage  Direct myofibrillar degeneration, adrenergic stimulation  Antimalarials . Chloroquine . Hydroxychloroquine Intracellular inhibition of lysosomal enzymes  Urologic Agents . Alpha Blockers o Doxazosin o Prazosin o Tamsulosin o Terazosin  Increased renin and aldosterone  Adapted from Page RL, et al. "Drugs That May Cause or Exacerbate Heart Failure: A Scientific Statement from the American Heart  Association." Circulation 2016; 134:e32-e69. DOI: 10.1161/CIR.0000000000000426   MEDICATION ADHERENCES TIPS AND STRATEGIES 1. Taking medication as prescribed improves patient outcomes in heart failure (reduces hospitalizations, improves symptoms, increases survival) 2. Side effects of medications can be managed by decreasing doses, switching agents, stopping drugs, or adding additional therapy. Please let someone in the Heart Failure Clinic know if you have having bothersome side effects so we can modify your regimen. Do not alter your medication regimen without talking to [place].  3. Medication reminders can help patients remember to take drugs on time. If you are missing or forgetting doses you can try linking behaviors, using pill boxes, or an electronic reminder like an alarm on your phone or an app. Some people can also get automated phone calls as medication reminders.

## 2019-12-04 ENCOUNTER — Ambulatory Visit: Payer: Self-pay | Admitting: Family

## 2019-12-30 NOTE — Progress Notes (Deleted)
Patient ID: Pamela Kramer, female    DOB: 11/09/1955, 64 y.o.   MRN: [id number]  HPI  Pamela Kramer is a 64 y/o female with a history of hyperlipidemia, HTN, obesity and heart failure.   Echo report from 03/12/2019 which showed an EF of 35-40% along with mild MR/TR.  Stress test done 04/18/2019 showed a normal study with low risk and an EF of 55-65%.   Has not been admitted or been in the ED in the last 6 months.    She presents today for a follow-up visit with a chief complaint of intermittent light-headedness. She says that this is sporadic in nature and isn't sure it correlates with her blood pressure. She has associated back pain at times along with this. She denies any difficulty sleeping, abdominal distention, palpitations, pedal edema, chest pain, wheezing, shortness of breath, cough, fatigue or weight gain.   She did resume taking her furosemide BID as she thought she was getting some swelling when it was reduced back to daily.    Past Medical History:  Diagnosis Date  . CHF (congestive heart failure) (HCC)   . Hyperlipidemia   . Hypertension   . Pre-diabetes    Past Surgical History:  Procedure Laterality Date  . TUBAL LIGATION     No family history on file. Social History   Tobacco Use  . Smoking status: Never Smoker  . Smokeless tobacco: Never Used  Substance Use Topics  . Alcohol use: Yes    Alcohol/week: 1.0 standard drink    Types: 1 Glasses of wine per week   Allergies  Allergen Reactions  . Excedrin Migraine  [Aspirin-Acetaminophen-Caffeine] Anaphylaxis  . Lisinopril Cough     Review of Systems  Constitutional: Negative for appetite change and fatigue.  HENT: Negative for congestion, postnasal drip and sore throat.   Eyes: Negative.   Respiratory: Negative for cough, chest tightness, shortness of breath and wheezing.        + snoring  Cardiovascular: Negative for chest pain, palpitations and leg swelling.  Gastrointestinal: Negative for abdominal  distention and abdominal pain.  Endocrine: Negative.   Musculoskeletal: Positive for back pain (tightness in back at times). Negative for neck pain.  Skin: Negative.   Allergic/Immunologic: Negative.   Neurological: Positive for light-headedness (at times). Negative for dizziness and weakness.  Hematological: Negative for adenopathy. Does not bruise/bleed easily.  Psychiatric/Behavioral: Negative for dysphoric mood and sleep disturbance (sleeping on 1 pillow). The patient is not nervous/anxious.       Physical Exam Vitals and nursing note reviewed.  Constitutional:      Appearance: Normal appearance.  HENT:     Head: Normocephalic and atraumatic.  Cardiovascular:     Rate and Rhythm: Normal rate and regular rhythm.  Pulmonary:     Effort: Pulmonary effort is normal. No respiratory distress.     Breath sounds: No wheezing or rales.  Abdominal:     General: There is no distension.     Palpations: Abdomen is soft.  Musculoskeletal:        General: No tenderness.     Cervical back: Normal range of motion and neck supple.     Right lower leg: No edema.     Left lower leg: No edema.  Skin:    General: Skin is warm and dry.  Neurological:     Mental Status: She is alert and oriented to person, place, and time.  Psychiatric:        Mood and Affect: Mood normal.  Behavior: Behavior normal.        Thought Content: Thought content normal.     Assessment & Plan:  1: Chronic heart failure with reduced ejection fraction- - NYHA class I - euvolemic today - weighing daily and says that her weight has stabilized; reminded to call for an overnight weight gain of >2 pounds or a weekly weight gain of >5 pounds - weight up 3 pounds from last visit 1 month ago - continues to be active - not adding salt but and trying to be mindful of sodium intake - will increase her entresto to 49/51mg  BID; advised her to take 2 tablets BID of her current dose and a new prescription was sent to  [employer] patient assistance program where she gets her entresto from (currently uninsured) - saw cardiology Juliann Pares) 03/21/2019 - BNP 03/11/2019 was 381.0 - PharmD reconciled medications with the patient  2: HTN- - BP mildly elevated today (home BP readings reviewed and do fluctuate)  - saw PCP Dareen Piano) in [place] on 09/04/19 - BMP 09/10/19 reviewed and showed sodium 143, potassium 4.1,. creatinine 0.77 and GFR 82  3: Snoring- - saw pulmonology Meredeth Ide) 07/16/19 - not waking herself up anymore so did not have sleep study done  4: Diabetes- - nonfasting glucose here today was 125 - A1c 09/10/19 was 6.4% - she is checking her glucose at home - could consider adding farxiga but would need patient assistance since she is uninsured   Medication bottles were reviewed.   Return in 5 weeks or sooner for any questions/problems before then.

## 2019-12-31 ENCOUNTER — Telehealth: Payer: Self-pay | Admitting: Family

## 2019-12-31 ENCOUNTER — Ambulatory Visit: Payer: Self-pay | Admitting: Family

## 2019-12-31 NOTE — Telephone Encounter (Signed)
Patient did not show for her [facility] Failure [facility] appointment on 12/31/19. Will attempt to reschedule.

## 2020-03-24 NOTE — Progress Notes (Signed)
Patient ID: Pamela Kramer, female    DOB: 12/26/1955, 64 y.o.   MRN: [id number]  HPI  Pamela Kramer is a 64 y/o female with a history of hyperlipidemia, HTN, obesity and heart failure.   Echo report from 03/12/2019 which showed an EF of 35-40% along with mild MR/TR.  Stress test done 04/18/2019 showed a normal study with low risk and an EF of 55-65%.   Has not been admitted or been in the ED in the last 6 months.    She presents today for a follow-up visit with a chief complaint of headaches. She says that she has a history of migraines but they generally occur every 1-2 years. For the last 6 weeks, she has one every night between the hours of 4-6am. Sometimes she has an aura without migraine along with this. She has associated light-headedness only when laying down, difficulty sleeping, gradual weight gain and stress/ anxiety along with this. She denies any abdominal distention, palpitations, pedal edema, chest pain, wheezing, shortness of breath, cough or fatigue.   Has had a stepdaughter & son both die of drug overdoses. Mom also recently passed away and she admits that she hasn't been taking very good care of herself over the last few months. Denies feeling depressed but is very tearful in the office and says it's because she's frustrated with her weight. Home BP's have been elevated at home during all of this as well.   M-F while working she takes furosemide 40mg  daily and on the weekends, she takes it as 40mg  BID.    Past Medical History:  Diagnosis Date  . CHF (congestive heart failure) (HCC)   . Hyperlipidemia   . Hypertension   . Pre-diabetes    Past Surgical History:  Procedure Laterality Date  . TUBAL LIGATION     No family history on file. Social History   Tobacco Use  . Smoking status: Never Smoker  . Smokeless tobacco: Never Used  Substance Use Topics  . Alcohol use: Yes    Alcohol/week: 1.0 standard drink    Types: 1 Glasses of wine per week   Allergies   Allergen Reactions  . Excedrin Migraine  [Aspirin-Acetaminophen-Caffeine] Anaphylaxis  . Lisinopril Cough   Prior to Admission medications   Medication Sig Start Date End Date Taking? Authorizing Provider  Ascorbic Acid (VITAMIN C WITH ROSE HIPS) 1000 MG tablet Take 1,000 mg by mouth in the morning and at bedtime.    Yes [provider]  atorvastatin (LIPITOR) 20 MG tablet Take 20 mg by mouth daily.   Yes [provider]  carvedilol (COREG) 6.25 MG tablet Take 1 tablet (6.25 mg total) by mouth 2 (two) times daily. 08/25/19  Yes Makalya Nave, A, FNP  Cholecalciferol (VITAMIN D3) 10 MCG (400 UNIT) tablet Take 400 Units by mouth daily.   Yes [provider]  furosemide (LASIX) 40 MG tablet Take 1 tablet (40 mg total) by mouth 2 (two) times daily. 09/25/19  Yes Harsimran Westman, Inetta Fermo A, FNP  metFORMIN (GLUCOPHAGE-XR) 500 MG 24 hr tablet Take 500 mg by mouth daily with breakfast.   Yes [provider]  omeprazole (PRILOSEC) 20 MG capsule Take 20 mg by mouth as needed (acid reflux).    Yes [provider]  potassium chloride (KLOR-CON) 10 MEQ tablet Take 1 tablet (10 mEq total) by mouth daily. 09/25/19  Yes Needham Biggins A, FNP  sacubitril-valsartan (ENTRESTO) 49-51 MG Take 1 tablet by mouth 2 (two) times daily.   Yes [provider]  zinc gluconate 50 MG tablet Take 50 mg by mouth daily.   Yes [provider]  ipratropium-albuterol (DUONEB) 0.5-2.5 (3) MG/3ML SOLN Take 3 mLs by nebulization 3 (three) times daily. Patient not taking: Reported on 03/25/2020    [provider]    Review of Systems  Constitutional: Negative for appetite change and fatigue.  HENT: Negative for congestion, postnasal drip and sore throat.   Eyes: Negative.   Respiratory: Negative for cough, chest tightness, shortness of breath and wheezing.        + snoring  Cardiovascular: Negative for chest pain, palpitations and leg swelling.  Gastrointestinal: Negative  for abdominal distention and abdominal pain.  Endocrine: Negative.   Genitourinary: Positive for urgency (after taking diuretic).  Musculoskeletal: Positive for back pain (tightness in back at times). Negative for neck pain.  Skin: Negative.   Allergic/Immunologic: Negative.   Neurological: Positive for light-headedness (when laying down at night) and headaches (HA every morning between 4-6 am). Negative for dizziness and weakness.  Hematological: Negative for adenopathy. Does not bruise/bleed easily.  Psychiatric/Behavioral: Positive for sleep disturbance (sleeping on 1 pillow). Negative for dysphoric mood. The patient is nervous/anxious.    Vitals:   03/25/20 0901  BP: 130/88  Pulse: 73  Resp: 20  SpO2: 100%  Weight: (!) 308 lb 4 oz (139.8 kg)  Height: 5\' 6"  (1.676 m)   Wt Readings from Last 3 Encounters:  03/25/20 (!) 308 lb 4 oz (139.8 kg)  10/30/19 297 lb 6 oz (134.9 kg)  09/25/19 294 lb (133.4 kg)   Lab Results  Component Value Date   CREATININE 0.78 04/14/2019   CREATININE 0.78 03/15/2019   CREATININE 1.04 (H) 03/14/2019    Physical Exam Vitals and nursing note reviewed.  Constitutional:      Appearance: Normal appearance.  HENT:     Head: Normocephalic and atraumatic.  Cardiovascular:     Rate and Rhythm: Normal rate and regular rhythm.  Pulmonary:     Effort: Pulmonary effort is normal. No respiratory distress.     Breath sounds: No wheezing or rales.  Abdominal:     General: There is no distension.     Palpations: Abdomen is soft.  Musculoskeletal:        General: No tenderness.     Cervical back: Normal range of motion and neck supple.     Right lower leg: No edema.     Left lower leg: No edema.  Skin:    General: Skin is warm and dry.  Neurological:     Mental Status: She is alert and oriented to person, place, and time.  Psychiatric:        Mood and Affect: Mood is anxious. Affect is tearful.        Behavior: Behavior normal.        Thought  Content: Thought content normal.    Assessment & Plan:  1: Chronic heart failure with reduced ejection fraction- - NYHA class I - euvolemic today - weighing daily and says that her weight has gradually risen; reminded to call for an overnight weight gain of >2 pounds or a weekly weight gain of >5 pounds - weight up 11 pounds from last visit 5 months ago - continues to be active at work but admits not eating like she should with all the family stress events these last few months - not adding salt but and trying to be mindful of sodium intake - saw cardiology 03/16/2019) 03/21/2019 - discussed  titrating up entresto next time and getting echo in the future but will hold off today as patient is anxious about what could be going on with her headaches, dizziness, incontinence and stress - BNP 03/11/2019 was 381.0 - patient interested in exercise as she used to go to the gym but admits that she isn't ready to return to that setting; discussed pulmonary rehab with her and she was interested in that so a referral was made  2: HTN- - BP looks good today in the office but home BP's have been elevated - saw PCP Dareen Piano) in [place] @ [facility] on 09/04/19 & returns tomorrow - BMP 09/10/19 reviewed and showed sodium 143, potassium 4.1,. creatinine 0.77 and GFR 82  3: Snoring- - saw pulmonology Meredeth Ide) 07/16/19 - not waking herself up anymore so did not have sleep study done  4: Diabetes- - fasting glucose at home today was 139 - A1c 09/10/19 was 6.4%  5: Stress- - patient reports daily headaches between the hours of 4-6 am along with dizziness when laying down (not when from rising) - tearful in the office and says that she's not depressed but frustrated with her weight gain - numerous family deaths in the last few months - emotional support given   Medication bottles were reviewed.   Return in 1 month or sooner for any questions/problems before then.

## 2020-03-25 ENCOUNTER — Ambulatory Visit: Payer: Self-pay | Attending: Family | Admitting: Family

## 2020-03-25 ENCOUNTER — Encounter: Payer: Self-pay | Admitting: Family

## 2020-03-25 ENCOUNTER — Other Ambulatory Visit: Payer: Self-pay

## 2020-03-25 VITALS — BP 130/88 | HR 73 | Resp 20 | Ht 66.0 in | Wt 308.2 lb

## 2020-03-25 DIAGNOSIS — Z7984 Long term (current) use of oral hypoglycemic drugs: Secondary | ICD-10-CM | POA: Insufficient documentation

## 2020-03-25 DIAGNOSIS — I110 Hypertensive heart disease with heart failure: Secondary | ICD-10-CM | POA: Insufficient documentation

## 2020-03-25 DIAGNOSIS — F439 Reaction to severe stress, unspecified: Secondary | ICD-10-CM

## 2020-03-25 DIAGNOSIS — I5022 Chronic systolic (congestive) heart failure: Secondary | ICD-10-CM | POA: Insufficient documentation

## 2020-03-25 DIAGNOSIS — E119 Type 2 diabetes mellitus without complications: Secondary | ICD-10-CM | POA: Insufficient documentation

## 2020-03-25 DIAGNOSIS — R0683 Snoring: Secondary | ICD-10-CM | POA: Insufficient documentation

## 2020-03-25 DIAGNOSIS — I10 Essential (primary) hypertension: Secondary | ICD-10-CM

## 2020-03-25 DIAGNOSIS — Z79899 Other long term (current) drug therapy: Secondary | ICD-10-CM | POA: Insufficient documentation

## 2020-03-25 DIAGNOSIS — E785 Hyperlipidemia, unspecified: Secondary | ICD-10-CM | POA: Insufficient documentation

## 2020-03-25 DIAGNOSIS — R519 Headache, unspecified: Secondary | ICD-10-CM | POA: Insufficient documentation

## 2020-03-25 NOTE — Patient Instructions (Signed)
Continue weighing daily and call for an overnight weight gain of > 2 pounds or a weekly weight gain of >5 pounds. 

## 2020-04-21 ENCOUNTER — Other Ambulatory Visit: Payer: Self-pay

## 2020-04-21 ENCOUNTER — Ambulatory Visit: Payer: Self-pay | Attending: Family | Admitting: Family

## 2020-04-21 ENCOUNTER — Encounter: Payer: Self-pay | Admitting: Family

## 2020-04-21 VITALS — BP 161/87 | HR 74 | Resp 18 | Ht 66.0 in | Wt 304.5 lb

## 2020-04-21 DIAGNOSIS — I10 Essential (primary) hypertension: Secondary | ICD-10-CM

## 2020-04-21 DIAGNOSIS — I110 Hypertensive heart disease with heart failure: Secondary | ICD-10-CM | POA: Insufficient documentation

## 2020-04-21 DIAGNOSIS — R0683 Snoring: Secondary | ICD-10-CM | POA: Insufficient documentation

## 2020-04-21 DIAGNOSIS — I5022 Chronic systolic (congestive) heart failure: Secondary | ICD-10-CM

## 2020-04-21 DIAGNOSIS — E119 Type 2 diabetes mellitus without complications: Secondary | ICD-10-CM | POA: Insufficient documentation

## 2020-04-21 DIAGNOSIS — Z7984 Long term (current) use of oral hypoglycemic drugs: Secondary | ICD-10-CM | POA: Insufficient documentation

## 2020-04-21 DIAGNOSIS — I509 Heart failure, unspecified: Secondary | ICD-10-CM | POA: Insufficient documentation

## 2020-04-21 DIAGNOSIS — E785 Hyperlipidemia, unspecified: Secondary | ICD-10-CM | POA: Insufficient documentation

## 2020-04-21 DIAGNOSIS — E669 Obesity, unspecified: Secondary | ICD-10-CM | POA: Insufficient documentation

## 2020-04-21 DIAGNOSIS — Z79899 Other long term (current) drug therapy: Secondary | ICD-10-CM | POA: Insufficient documentation

## 2020-04-21 NOTE — Patient Instructions (Signed)
Continue weighing daily and call for an overnight weight gain of > 2 pounds or a weekly weight gain of >5 pounds. 

## 2020-04-21 NOTE — Progress Notes (Signed)
Patient ID: Pamela Kramer, female    DOB: 03-Jun-1955, 64 y.o.   MRN: [id number]  HPI  Pamela Kramer is Kramer 64 y/o female with Kramer history of hyperlipidemia, HTN, obesity and heart failure.   Echo report from 03/12/2019 which showed an EF of 35-40% along with mild MR/TR.  Stress test done 04/18/2019 showed Kramer normal study with low risk and an EF of 55-65%.   Has not been admitted or been in the ED in the last 6 months.    She presents today for Kramer follow-up visit with Kramer chief complaint of minimal shortness of breath upon moderate exertion. She describes this as chronic in nature having been present for several years with varying levels of intensity. She has associated headaches (improving), light-headedness when laying down, anxiety and difficulty sleeping along with this. She denies ay abdominal distention, palpitations, pedal edema, chest pain, cough, fatigue or weight gain.   She continues with headaches but says that they are better and that she hasn't had Kramer migraine since being put on topamax.   Past Medical History:  Diagnosis Date  . CHF (congestive heart failure) (HCC)   . Hyperlipidemia   . Hypertension   . Pre-diabetes    Past Surgical History:  Procedure Laterality Date  . TUBAL LIGATION     No family history on file. Social History   Tobacco Use  . Smoking status: Never Smoker  . Smokeless tobacco: Never Used  Substance Use Topics  . Alcohol use: Yes    Alcohol/week: 1.0 standard drink    Types: 1 Glasses of wine per week   Allergies  Allergen Reactions  . Excedrin Migraine  [Aspirin-Acetaminophen-Caffeine] Anaphylaxis  . Lisinopril Cough   Prior to Admission medications   Medication Sig Start Date End Date Taking? Authorizing Provider  Ascorbic Acid (VITAMIN C WITH ROSE HIPS) 1000 MG tablet Take 1,000 mg by mouth in the morning and at bedtime.    Yes [provider]  atorvastatin (LIPITOR) 20 MG tablet Take 20 mg by mouth daily.   Yes [provider]  carvedilol (COREG) 6.25 MG tablet Take 1 tablet (6.25 mg total) by mouth 2 (two) times daily. Patient taking differently: Take 12.5 mg by mouth 2 (two) times daily.  08/25/19  Yes Pamela Kramer, Pamela Fermo A, FNP  Cholecalciferol (VITAMIN D3) 10 MCG (400 UNIT) tablet Take 400 Units by mouth daily.   Yes [provider]  furosemide (LASIX) 40 MG tablet Take 1 tablet (40 mg total) by mouth 2 (two) times daily. 09/25/19  Yes Pamela Kramer A, FNP  ipratropium-albuterol (DUONEB) 0.5-2.5 (3) MG/3ML SOLN Take 3 mLs by nebulization 3 (three) times daily.    Yes [provider]  metFORMIN (GLUCOPHAGE-XR) 500 MG 24 hr tablet Take 500 mg by mouth daily with breakfast.   Yes [provider]  omeprazole (PRILOSEC) 20 MG capsule Take 20 mg by mouth as needed (acid reflux).    Yes [provider]  potassium chloride (KLOR-CON) 10 MEQ tablet Take 1 tablet (10 mEq total) by mouth daily. 09/25/19  Yes Pamela Kramer A, FNP  sacubitril-valsartan (ENTRESTO) 49-51 MG Take 1 tablet by mouth 2 (two) times daily.   Yes [provider]  topiramate (TOPAMAX) 50 MG tablet Take 50 mg by mouth at bedtime.    Yes [provider]  zinc gluconate 50 MG tablet Take 50 mg by mouth daily.   Yes [provider]     Review of Systems  Constitutional: Negative for  appetite change and fatigue.  HENT: Negative for congestion, postnasal drip and sore throat.   Eyes: Negative.   Respiratory: Positive for shortness of breath. Negative for cough, chest tightness and wheezing.        + snoring  Cardiovascular: Negative for chest pain, palpitations and leg swelling.  Gastrointestinal: Negative for abdominal distention and abdominal pain.  Endocrine: Negative.   Genitourinary: Positive for urgency (after taking diuretic).  Musculoskeletal: Positive for back pain (tightness in back at times). Negative for neck pain.  Skin: Negative.   Allergic/Immunologic: Negative.   Neurological:  Positive for light-headedness (when laying down at night) and headaches (improving). Negative for dizziness and weakness.  Hematological: Negative for adenopathy. Does not bruise/bleed easily.  Psychiatric/Behavioral: Positive for sleep disturbance (sleeping on 1 pillow). Negative for dysphoric mood. The patient is nervous/anxious.    Vitals:   04/21/20 0837  BP: (!) 161/87  Pulse: 74  Resp: 18  SpO2: 100%  Weight: (!) 304 lb 8 oz (138.1 kg)  Height: 5\' 6"  (1.676 m)   Wt Readings from Last 3 Encounters:  04/21/20 (!) 304 lb 8 oz (138.1 kg)  03/25/20 (!) 308 lb 4 oz (139.8 kg)  10/30/19 297 lb 6 oz (134.9 kg)   Lab Results  Component Value Date   CREATININE 0.78 04/14/2019   CREATININE 0.78 03/15/2019   CREATININE 1.04 (H) 03/14/2019    Physical Exam Vitals and nursing note reviewed.  Constitutional:      Appearance: Normal appearance.  HENT:     Head: Normocephalic and atraumatic.  Cardiovascular:     Rate and Rhythm: Normal rate and regular rhythm.  Pulmonary:     Effort: Pulmonary effort is normal. No respiratory distress.     Breath sounds: No wheezing or rales.  Abdominal:     General: There is no distension.     Palpations: Abdomen is soft.  Musculoskeletal:        General: No tenderness.     Cervical back: Normal range of motion and neck supple.     Right lower leg: No edema.     Left lower leg: No edema.  Skin:    General: Skin is warm and dry.  Neurological:     Mental Status: She is alert and oriented to person, place, and time.  Psychiatric:        Mood and Affect: Mood normal.        Behavior: Behavior normal.        Thought Content: Thought content normal.    Assessment & Plan:  1: Chronic heart failure with reduced ejection fraction- - NYHA class II - euvolemic today - weighing daily & home weight chart reviewed; reminded to call for an overnight weight gain of >2 pounds or Kramer weekly weight gain of >5 pounds - weight down 4 pounds from last  visit 1 month ago - continues to be active at work  - not adding salt but and trying to be mindful of sodium intake - saw cardiology 03/16/2019) 03/21/2019 - home BP's too low to titrate up entresto - BNP 03/11/2019 was 381.0  2: HTN- - BP elevated today but she hasn't taken her medications yet today - home BP log reviewed and ranges from 107-136/74-90 - saw PCP in [place] @ [facility] on 03/26/20 - BMP 03/26/20 reviewed and showed sodium 141, potassium 4.1,. creatinine 0.75 and GFR 85  3: Snoring- - saw pulmonology 03/28/20) 07/16/19 - not waking herself up anymore so did not have  sleep study done  4: Diabetes- - fasting glucose at home today was 141 - A1c 03/26/20 was 6.5%   Medication bottles were reviewed.   Return in 6 months or sooner for any questions/problems before then.

## 2020-05-27 ENCOUNTER — Telehealth: Payer: Self-pay | Admitting: Family

## 2020-05-27 NOTE — Telephone Encounter (Signed)
Called to notify patient that her [employer] patient assistance application is up for renewl and she needs to come in and sign it.   Tayo Maute, NT

## 2020-10-13 ENCOUNTER — Telehealth: Payer: Self-pay | Admitting: Family

## 2020-10-13 NOTE — Telephone Encounter (Signed)
Notified patient that she was approved for [facility] patient assistance with [employer] till end of year and I requested they go ahead and fill her perscription for 90 days.    Nakeem Murnane, NT

## 2020-10-19 ENCOUNTER — Other Ambulatory Visit: Payer: Self-pay

## 2020-10-19 ENCOUNTER — Encounter: Payer: Self-pay | Admitting: Pharmacist

## 2020-10-19 ENCOUNTER — Ambulatory Visit: Payer: Medicare HMO | Attending: Family | Admitting: Family

## 2020-10-19 ENCOUNTER — Encounter: Payer: Self-pay | Admitting: Family

## 2020-10-19 VITALS — BP 180/98 | HR 75 | Resp 18 | Ht 66.0 in | Wt 311.5 lb

## 2020-10-19 DIAGNOSIS — I5022 Chronic systolic (congestive) heart failure: Secondary | ICD-10-CM | POA: Diagnosis not present

## 2020-10-19 DIAGNOSIS — Z886 Allergy status to analgesic agent status: Secondary | ICD-10-CM | POA: Insufficient documentation

## 2020-10-19 DIAGNOSIS — E119 Type 2 diabetes mellitus without complications: Secondary | ICD-10-CM

## 2020-10-19 DIAGNOSIS — R519 Headache, unspecified: Secondary | ICD-10-CM | POA: Insufficient documentation

## 2020-10-19 DIAGNOSIS — Z7984 Long term (current) use of oral hypoglycemic drugs: Secondary | ICD-10-CM | POA: Diagnosis not present

## 2020-10-19 DIAGNOSIS — Z888 Allergy status to other drugs, medicaments and biological substances status: Secondary | ICD-10-CM | POA: Insufficient documentation

## 2020-10-19 DIAGNOSIS — R0602 Shortness of breath: Secondary | ICD-10-CM | POA: Diagnosis not present

## 2020-10-19 DIAGNOSIS — I10 Essential (primary) hypertension: Secondary | ICD-10-CM

## 2020-10-19 DIAGNOSIS — I110 Hypertensive heart disease with heart failure: Secondary | ICD-10-CM | POA: Diagnosis present

## 2020-10-19 LAB — BASIC METABOLIC PANEL
Anion gap: 10 (ref 5–15)
BUN: 10 mg/dL (ref 8–23)
CO2: 27 mmol/L (ref 22–32)
Calcium: 9.5 mg/dL (ref 8.9–10.3)
Chloride: 105 mmol/L (ref 98–111)
Creatinine, Ser: 0.64 mg/dL (ref 0.44–1.00)
GFR, Estimated: >60 mL/min (ref >60)
Glucose, Bld: 128 mg/dL — ABNORMAL HIGH (ref 70–99)
Potassium: 3.8 mmol/L (ref 3.5–5.1)
Sodium: 142 mmol/L (ref 135–145)

## 2020-10-19 NOTE — Patient Instructions (Addendum)
Continue weighing daily and call for an overnight weight gain of > 2 pounds or a weekly weight gain of >5 pounds.   Resume entresto as 24/26mg  twice a day

## 2020-10-19 NOTE — Progress Notes (Signed)
[facility] - PHARMACIST COUNSELING NOTE  Guideline-Directed Medical Therapy/Evidence Based Medicine  ACE/ARB/ARNI:  Delene Loll (Sacubitril/Valsartan) 49/51 mg twice daily Beta Blocker: carvedilol 12.5 mg BID Aldosterone Antagonist: None Diuretic: furosemide (LASIX) 20mg  QAM and 40 mg QPM SGLT2i: None  Adherence Assessment  Do you ever forget to take your medication? [] Yes [x] No  Do you ever skip doses due to side effects? [x] Yes [] No  Do you have trouble affording your medicines? [] Yes [x] No  Are you ever unable to pick up your medication due to transportation difficulties? [] Yes [x] No  Do you ever stop taking your medications because you don't believe they are helping? [] Yes [x] No   Adherence strategy: Pt reports not taking her medications for 3 weeks due to side effects (overactive bladder, R sided leg pain, L lower back pain, double vision)  Recommendations given to patient about increasing adherence: Instructed pt to call clinic prior to stopping medications    Vital signs: HR 75, BP 203/115, weight (pounds) 311.8 ECHO: Date 03/12/2019, EF 35-40%, notes no LVH, LA mildly dilated  BMP Latest Ref Rng & Units 04/14/2019 03/15/2019 03/14/2019  Glucose 70 - 99 mg/dL 175(H) 140(H) 147(H)  BUN 8 - 23 mg/dL 11 21 20   Creatinine 0.44 - 1.00 mg/dL 0.78 0.78 1.04(H)  Sodium 135 - 145 mmol/L 140 138 137  Potassium 3.5 - 5.1 mmol/L 3.5 3.9 3.9  Chloride 98 - 111 mmol/L 103 98 102  CO2 22 - 32 mmol/L 24 29 31   Calcium 8.9 - 10.3 mg/dL 9.1 8.8(L) 8.8(L)    Past Medical History:  Diagnosis Date  . CHF (congestive heart failure) ([facility])   . Hyperlipidemia   . Hypertension   . Pre-diabetes     ASSESSMENT 65 year old female who presents to the HF clinic for follow up appointment. Pt reports not taking her medications (except Kcl) for 3 weeks because she was experiencing overactive bladder, double vision, L lower back pain, and R sided leg  pain. Pt BP prior to stopping medications was still elevated around 160s/100s and remained elevated 150s/100s after stopping medications. When pt was taking Furosemide she reports taking 20mg  QAM and 40mg  QPM instead of 40mg  BID since it was making her have to pee a lot. Pt does state she has had migraines in the past. Discussed with pt to call clinic prior to stopping her medications and that it is possible some of her symptoms could have been related to elevated blood pressure. Pt had her weights, BG (100s), and BPs recorded in her agenda.   Recent ED Visit (past 6 months): N/A  PLAN CHF/HTN -since pt has been off of meds for 3 weeks, discussed with provider about restarting agents. Plan was to restart furosemide, however pt did not want to restart furosemide due to overactive bladder. Pt to restart Entresto at lower dose of 24/26 mg BID -restart other prior meds as tolerated to get her back on her previous GDMT regimen -counseled pt on importance of compliance  HLD -restart taking atorvastatin 20 mg daily  Pre-diabetes -03/26/2020 Hgb A1c 6.5 -BG has been running in the 100s -restart taking metformin 500 mg BID  -continue checking BG   Time spent: 15 minutes  Sherilyn Banker, PharmD Pharmacy Resident  10/19/2020 9:33 AM    Current Outpatient Medications:  .  Ascorbic Acid (VITAMIN C WITH ROSE HIPS) 1000 MG tablet, Take 1,000 mg by mouth 3 (three) times daily. (Patient not taking: Reported on 10/19/2020), Disp: , Rfl:  .  atorvastatin (LIPITOR) 20 MG tablet, Take 20 mg by mouth daily. (Patient not taking: Reported on 10/19/2020), Disp: , Rfl:  .  carvedilol (COREG) 6.25 MG tablet, Take 1 tablet (6.25 mg total) by mouth 2 (two) times daily. (Patient not taking: Reported on 10/19/2020), Disp: 180 tablet, Rfl: 3 .  Cholecalciferol (VITAMIN D3) 10 MCG (400 UNIT) tablet, Take 2,000 Units by mouth daily. (Patient not taking: Reported on 10/19/2020), Disp: , Rfl:  .  furosemide (LASIX) 40 MG  tablet, Take 1 tablet (40 mg total) by mouth 2 (two) times daily. (Patient not taking: Reported on 10/19/2020), Disp: 180 tablet, Rfl: 3 .  ipratropium-albuterol (DUONEB) 0.5-2.5 (3) MG/3ML SOLN, Take 3 mLs by nebulization 3 (three) times daily.  (Patient not taking: Reported on 10/19/2020), Disp: , Rfl:  .  metFORMIN (GLUCOPHAGE-XR) 500 MG 24 hr tablet, Take 500 mg by mouth 2 times daily at 12 noon and 4 pm. (Patient not taking: Reported on 10/19/2020), Disp: , Rfl:  .  omeprazole (PRILOSEC) 20 MG capsule, Take 20 mg by mouth as needed (acid reflux).  (Patient not taking: Reported on 10/19/2020), Disp: , Rfl:  .  potassium chloride (KLOR-CON) 10 MEQ tablet, Take 1 tablet (10 mEq total) by mouth daily., Disp: 90 tablet, Rfl: 3 .  sacubitril-valsartan (ENTRESTO) 49-51 MG, Take 0.5 tablets by mouth 2 (two) times daily., Disp: , Rfl:  .  topiramate (TOPAMAX) 50 MG tablet, Take 50 mg by mouth at bedtime.  (Patient not taking: Reported on 10/19/2020), Disp: , Rfl:  .  zinc gluconate 50 MG tablet, Take 50 mg by mouth daily. (Patient not taking: Reported on 10/19/2020), Disp: , Rfl:    COUNSELING POINTS/CLINICAL PEARLS Carvedilol (Goal: weight less than 85 kg is 25 mg BID, weight greater than 85 kg is 50 mg BID)  Patient should avoid activities requiring coordination until drug effects are realized, as drug may cause dizziness.  This drug may cause diarrhea, nausea, vomiting, arthralgia, back pain, myalgia, headache, vision disorder, erectile dysfunction, reduced libido, or fatigue.  Instruct patient to report signs/symptoms of adverse cardiovascular effects such as hypotension (especially in elderly patients), arrhythmias, syncope, palpitations, angina, or edema.  Drug may mask symptoms of hypoglycemia. Advise diabetic patients to carefully monitor blood sugar levels.  Patient should take drug with food.  Advise patient against sudden discontinuation of drug. Entresto (Goal: 97/103 mg twice daily)  Warn  female patient to avoid pregnancy during therapy and to report a pregnancy to a physician.  Advise patient to report symptomatic hypotension.  Side effects may include hyperkalemia, cough, dizziness, or renal failure. Furosemide  Drug causes sun-sensitivity. Advise patient to use sunscreen and avoid tanning beds. Patient should avoid activities requiring coordination until drug effects are realized, as drug may cause dizziness, vertigo, or blurred vision. This drug may cause hyperglycemia, hyperuricemia, constipation, diarrhea, loss of appetite, nausea, vomiting, purpuric disorder, cramps, spasticity, asthenia, headache, paresthesia, or scaling eczema. Instruct patient to report unusual bleeding/bruising or signs/symptoms of hypotension, infection, pancreatitis, or ototoxicity (tinnitus, hearing impairment). Advise patient to report signs/symptoms of a severe skin reactions (flu-like symptoms, spreading red rash, or skin/mucous membrane blistering) or erythema multiforme. Instruct patient to eat high-potassium foods during drug therapy, as directed by healthcare professional.  Patient should not drink alcohol while taking this drug.  DRUGS TO AVOID IN HEART FAILURE  Drug or Class Mechanism  Analgesics . NSAIDs . COX-2 inhibitors . Glucocorticoids  Sodium and water retention, increased systemic vascular resistance, decreased response to diuretics  Diabetes Medications . Metformin . Thiazolidinediones o Rosiglitazone (Avandia) o Pioglitazone (Actos) . DPP4 Inhibitors o Saxagliptin (Onglyza) o Sitagliptin (Januvia)   Lactic acidosis Possible calcium channel blockade   Unknown  Antiarrhythmics . Class I  o Flecainide o Disopyramide . Class III o Sotalol . Other o Dronedarone  Negative inotrope, proarrhythmic   Proarrhythmic, beta blockade  Negative inotrope  Antihypertensives . Alpha Blockers o Doxazosin . Calcium Channel  Blockers o Diltiazem o Verapamil o Nifedipine . Central Alpha Adrenergics o Moxonidine . Peripheral Vasodilators o Minoxidil  Increases renin and aldosterone  Negative inotrope    Possible sympathetic withdrawal  Unknown  Anti-infective . Itraconazole . Amphotericin B  Negative inotrope Unknown  Hematologic . Anagrelide . Cilostazol   Possible inhibition of PD IV Inhibition of PD III causing arrhythmias  Neurologic/Psychiatric . Stimulants . Anti-Seizure Drugs o Carbamazepine o Pregabalin . Antidepressants o Tricyclics o Citalopram . Parkinsons o Bromocriptine o Pergolide o Pramipexole . Antipsychotics o Clozapine . Antimigraine o Ergotamine o Methysergide . Appetite suppressants . Bipolar o Lithium  Peripheral alpha and beta agonist activity  Negative inotrope and chronotrope Calcium channel blockade  Negative inotrope, proarrhythmic Dose-dependent QT prolongation  Excessive serotonin activity/valvular damage Excessive serotonin activity/valvular damage Unknown  IgE mediated hypersensitivy, calcium channel blockade  Excessive serotonin activity/valvular damage Excessive serotonin activity/valvular damage Valvular damage  Direct myofibrillar degeneration, adrenergic stimulation  Antimalarials . Chloroquine . Hydroxychloroquine Intracellular inhibition of lysosomal enzymes  Urologic Agents . Alpha Blockers o Doxazosin o Prazosin o Tamsulosin o Terazosin  Increased renin and aldosterone  Adapted from Page RL, et al. "Drugs That May Cause or Exacerbate Heart Failure: A Scientific Statement from the [employer]." Circulation 2016; 782:N56-O13. DOI: 10.1161/CIR.0000000000000426   MEDICATION ADHERENCES TIPS AND STRATEGIES 1. Taking medication as prescribed improves patient outcomes in heart failure (reduces hospitalizations, improves symptoms, increases survival) 2. Side effects of medications can be managed by decreasing  doses, switching agents, stopping drugs, or adding additional therapy. Please let someone in the [facility] know if you have having bothersome side effects so we can modify your regimen. Do not alter your medication regimen without talking to [place].  3. Medication reminders can help patients remember to take drugs on time. If you are missing or forgetting doses you can try linking behaviors, using pill boxes, or an electronic reminder like an alarm on your phone or an app. Some people can also get automated phone calls as medication reminders.

## 2020-10-19 NOTE — Progress Notes (Signed)
Patient ID: Pamela Kramer, female    DOB: 09/05/1955, 65 y.o.   MRN: [id number]  HPI  Pamela Kramer is a 65 y/o female with a history of hyperlipidemia, HTN, obesity and heart failure.   Echo report from 03/12/2019 which showed an EF of 35-40% along with mild MR/TR.  Stress test done 04/18/2019 showed a normal study with low risk and an EF of 55-65%.   Has not been admitted or been in the ED in the last 6 months.    She presents today for a follow-up visit with a chief complaint of minimal shortness of breath upon moderate exertion. She describes this as chronic in nature having been present for several years. She has associated pedal edema, headaches, back pain, difficulty sleeping and anxiety along with this. She denies any dizziness, abdominal distention, palpitations, chest pain, cough, fatigue or weight gain.   Says that she was having migraines 4-5 / day along with low back pain and right sided leg pain. She stopped all her medications except her potassium and symptoms have improved so she's not sure which medication caused them. She now has daily headaches but they aren't migraine in intensity with improvement in her pain. She says that when taking the diuretic, she would have such urgency that it made it difficult to get to the bathroom in a timely manner.   Past Medical History:  Diagnosis Date  . CHF (congestive heart failure) ([facility])   . Hyperlipidemia   . Hypertension   . Pre-diabetes    Past Surgical History:  Procedure Laterality Date  . TUBAL LIGATION     No family history on file. Social History   Tobacco Use  . Smoking status: Never Smoker  . Smokeless tobacco: Never Used  Substance Use Topics  . Alcohol use: Yes    Alcohol/week: 1.0 standard drink    Types: 1 Glasses of wine per week   Allergies  Allergen Reactions  . Excedrin Migraine  [Aspirin-Acetaminophen-Caffeine] Anaphylaxis  . Lisinopril Cough   Prior to Admission medications   Medication Sig Start  Date End Date Taking? Authorizing Provider  potassium chloride (KLOR-CON) 10 MEQ tablet Take 1 tablet (10 mEq total) by mouth daily. 09/25/19  Yes Sandra Brents, Inetta Fermo A, FNP  Ascorbic Acid (VITAMIN C WITH ROSE HIPS) 1000 MG tablet Take 1,000 mg by mouth 3 (three) times daily. Patient not taking: Reported on 10/19/2020    [provider]  atorvastatin (LIPITOR) 20 MG tablet Take 20 mg by mouth daily. Patient not taking: Reported on 10/19/2020    [provider]  carvedilol (COREG) 6.25 MG tablet Take 1 tablet (6.25 mg total) by mouth 2 (two) times daily. Patient not taking: Reported on 10/19/2020 08/25/19   Delma Freeze, FNP  Cholecalciferol (VITAMIN D3) 10 MCG (400 UNIT) tablet Take 2,000 Units by mouth daily. Patient not taking: Reported on 10/19/2020    [provider]  furosemide (LASIX) 40 MG tablet Take 1 tablet (40 mg total) by mouth 2 (two) times daily. Patient not taking: Reported on 10/19/2020 09/25/19   Clarisa Kindred A, FNP  ipratropium-albuterol (DUONEB) 0.5-2.5 (3) MG/3ML SOLN Take 3 mLs by nebulization 3 (three) times daily.  Patient not taking: Reported on 10/19/2020    [provider]  metFORMIN (GLUCOPHAGE-XR) 500 MG 24 hr tablet Take 500 mg by mouth 2 times daily at 12 noon and 4 pm. Patient not taking: Reported on 10/19/2020    [provider]  omeprazole (PRILOSEC) 20 MG capsule Take  20 mg by mouth as needed (acid reflux).  Patient not taking: Reported on 10/19/2020    [provider]  sacubitril-valsartan (ENTRESTO) 49-51 MG Take 1 tablet by mouth 2 (two) times daily. Patient not taking: Reported on 10/19/2020    [provider]  topiramate (TOPAMAX) 50 MG tablet Take 50 mg by mouth at bedtime.  Patient not taking: Reported on 10/19/2020    [provider]  zinc gluconate 50 MG tablet Take 50 mg by mouth daily. Patient not taking: Reported on 10/19/2020    [provider]   Review of Systems  Constitutional:  Negative for appetite change and fatigue.  HENT: Negative for congestion, postnasal drip and sore throat.   Eyes: Negative.   Respiratory: Positive for shortness of breath. Negative for cough, chest tightness and wheezing.        + snoring  Cardiovascular: Positive for leg swelling (ankles at the end of the work day). Negative for chest pain and palpitations.  Gastrointestinal: Negative for abdominal distention and abdominal pain.  Endocrine: Negative.   Genitourinary: Negative.   Musculoskeletal: Positive for back pain (tightness in back at times). Negative for neck pain.  Skin: Negative.   Allergic/Immunologic: Negative.   Neurological: Positive for headaches (daily). Negative for dizziness, weakness and light-headedness.  Hematological: Negative for adenopathy. Does not bruise/bleed easily.  Psychiatric/Behavioral: Positive for sleep disturbance (sleeping better on 1 pillow). Negative for dysphoric mood. The patient is nervous/anxious.    Vitals:   10/19/20 0851 10/19/20 0918  BP: (!) 203/115 (!) 180/98  Pulse: 75   Resp: 18   SpO2: 98%   Weight: (!) 311 lb 8 oz (141.3 kg)   Height: 5\' 6"  (1.676 m)    Wt Readings from Last 3 Encounters:  10/19/20 (!) 311 lb 8 oz (141.3 kg)  04/21/20 (!) 304 lb 8 oz (138.1 kg)  03/25/20 (!) 308 lb 4 oz (139.8 kg)   Lab Results  Component Value Date   CREATININE 0.64 10/19/2020   CREATININE 0.78 04/14/2019   CREATININE 0.78 03/15/2019    Physical Exam Vitals and nursing note reviewed.  Constitutional:      Appearance: Normal appearance.  HENT:     Head: Normocephalic and atraumatic.  Cardiovascular:     Rate and Rhythm: Normal rate and regular rhythm.  Pulmonary:     Effort: Pulmonary effort is normal. No respiratory distress.     Breath sounds: No wheezing or rales.  Abdominal:     General: There is no distension.     Palpations: Abdomen is soft.  Musculoskeletal:        General: No tenderness.     Cervical back: Normal range of  motion and neck supple.     Right lower leg: No edema.     Left lower leg: No edema.  Skin:    General: Skin is warm and dry.  Neurological:     Mental Status: She is alert and oriented to person, place, and time.  Psychiatric:        Mood and Affect: Mood normal.        Behavior: Behavior normal.        Thought Content: Thought content normal.    Assessment & Plan:  1: Chronic heart failure with reduced ejection fraction- - NYHA class II - euvolemic today - weighing daily & home weight chart reviewed; reminded to call for an overnight weight gain of >2 pounds or a weekly weight gain of >5 pounds - weight up  7 pounds from last visit 6 months ago - continues to be active at work  - not adding salt but and trying to be mindful of sodium intake - saw cardiology Juliann Pares) 03/21/2019 - discussed resuming diuretic but patient really doesn't want to resume this; is agreeable to resuming entresto so will start back at 24/26mg  BID - will get BMP today and next visit as well - BNP 03/11/2019 was 381.0 - PharmD reconciled medications with the patient  2: HTN- - BP elevated but she stopped her meds a few weeks ago; restarting entresto per above - home BP log reviewed and shows 141-170/100-113 - saw PCP in [place] @ [facility] on 03/26/20; planning on changing PCP's - BMP 03/26/20 reviewed and showed sodium 141, potassium 4.1,. creatinine 0.75 and GFR 85; rechecking today  3: Diabetes- - fasting glucose at home today was 151 - A1c 03/26/20 was 6.5%   Medication bottles were reviewed.   Return in 2 weeks or sooner for any questions/problems before then.   Labs received before note closure and patient was advised of BMP results obtained earlier today.

## 2020-11-09 ENCOUNTER — Encounter: Payer: Self-pay | Admitting: Pharmacist

## 2020-11-09 ENCOUNTER — Ambulatory Visit: Payer: Medicare HMO | Attending: Family | Admitting: Family

## 2020-11-09 ENCOUNTER — Encounter: Payer: Self-pay | Admitting: Family

## 2020-11-09 ENCOUNTER — Other Ambulatory Visit: Payer: Self-pay

## 2020-11-09 VITALS — BP 144/98 | HR 76 | Resp 20 | Ht 66.0 in | Wt 314.1 lb

## 2020-11-09 DIAGNOSIS — R519 Headache, unspecified: Secondary | ICD-10-CM | POA: Insufficient documentation

## 2020-11-09 DIAGNOSIS — R5383 Other fatigue: Secondary | ICD-10-CM | POA: Insufficient documentation

## 2020-11-09 DIAGNOSIS — R109 Unspecified abdominal pain: Secondary | ICD-10-CM | POA: Insufficient documentation

## 2020-11-09 DIAGNOSIS — I110 Hypertensive heart disease with heart failure: Secondary | ICD-10-CM | POA: Diagnosis not present

## 2020-11-09 DIAGNOSIS — Z7901 Long term (current) use of anticoagulants: Secondary | ICD-10-CM | POA: Insufficient documentation

## 2020-11-09 DIAGNOSIS — E119 Type 2 diabetes mellitus without complications: Secondary | ICD-10-CM | POA: Insufficient documentation

## 2020-11-09 DIAGNOSIS — Z79899 Other long term (current) drug therapy: Secondary | ICD-10-CM | POA: Insufficient documentation

## 2020-11-09 DIAGNOSIS — E669 Obesity, unspecified: Secondary | ICD-10-CM | POA: Diagnosis not present

## 2020-11-09 DIAGNOSIS — G479 Sleep disorder, unspecified: Secondary | ICD-10-CM | POA: Diagnosis not present

## 2020-11-09 DIAGNOSIS — R059 Cough, unspecified: Secondary | ICD-10-CM | POA: Insufficient documentation

## 2020-11-09 DIAGNOSIS — Z888 Allergy status to other drugs, medicaments and biological substances status: Secondary | ICD-10-CM | POA: Insufficient documentation

## 2020-11-09 DIAGNOSIS — E785 Hyperlipidemia, unspecified: Secondary | ICD-10-CM | POA: Diagnosis not present

## 2020-11-09 DIAGNOSIS — I5022 Chronic systolic (congestive) heart failure: Secondary | ICD-10-CM | POA: Diagnosis not present

## 2020-11-09 DIAGNOSIS — R0602 Shortness of breath: Secondary | ICD-10-CM | POA: Insufficient documentation

## 2020-11-09 DIAGNOSIS — Z7984 Long term (current) use of oral hypoglycemic drugs: Secondary | ICD-10-CM | POA: Diagnosis not present

## 2020-11-09 DIAGNOSIS — I10 Essential (primary) hypertension: Secondary | ICD-10-CM

## 2020-11-09 NOTE — Patient Instructions (Addendum)
Continue weighing daily and call for an overnight weight gain of > 2 pounds or a weekly weight gain of >5 pounds.    Stop entresto and resume carvedilol 6.25mg  BID   Take your lasix as needed for swelling or above weight gain

## 2020-11-09 NOTE — Progress Notes (Signed)
[facility] - HEART FAILURE CLINIC - PHARMACIST COUNSELING NOTE  Guideline-Directed Medical Therapy/Evidence Based Medicine  ACE/ARB/ARNI: Sacubitril-valsartan 24-26 mg twice daily Beta Blocker:  N/A - pt has carvedilol but not taking Aldosterone Antagonist:  N/A Diuretic:  N/A - pt has furosemide but not taking SGLT2i:  N/A  Adherence Assessment  Do you ever forget to take your medication? [] Yes [x] No  Do you ever skip doses due to side effects? [x] Yes [] No  Do you have trouble affording your medicines? [] Yes [x] No  Are you ever unable to pick up your medication due to transportation difficulties? [] Yes [x] No  Do you ever stop taking your medications because you don't believe they are helping? [] Yes [x] No  Do you check your weight daily? [x] Yes [] No   Adherence strategy: Pt endorses adherence to her Entresto and potassium; she is experiencing the same side effects from her last visit since restarting Entresto  Barriers to obtaining medications: N/A  Vital signs: HR 76, BP 144/98, weight (pounds) 314.2 ECHO: Date 03/12/2019, EF 35-40%, notes no LVH, LA mildly dilated  BMP Latest Ref Rng & Units 10/19/2020 04/14/2019 03/15/2019  Glucose 70 - 99 mg/dL ) ) )  BUN 8 - 23 mg/dL 10 11 21   Creatinine 0.44 - 1.00 mg/dL    Sodium 135 - 145 mmol/L 142 140 138  Potassium 3.5 - 5.1 mmol/L 3.8 3.5 3.9  Chloride 98 - 111 mmol/L 105 103 98  CO2 22 - 32 mmol/L 27 24 29   Calcium 8.9 - 10.3 mg/dL 9.5 9.1 )    Past Medical History:  Diagnosis Date   CHF (congestive heart failure) (HCC)    Hyperlipidemia    Hypertension    Pre-diabetes     ASSESSMENT 65 year old female who presents to the HF clinic for a follow up visit. At her last visit on 10/19/2020 pt was restarted on her Entresto at a lower dose of 24-26 mg twice daily. Since her last visit pt has been compliant but is experiencing the same side effects - headaches, double  vision, leg/lower back pain, and nausea. Seems pt may be intolerant to Entresto. Pt did state she has gained some weight this week and might take a dose of her furosemide. Pt brought in her agenda where she has been documenting her BP, BG, and side effects daily.   Recent ED Visit (past 6 months): Date - N/A  PLAN CHF/HTN -stop Entresto due to side effects (has been added to her allergy list); pt has had cough with ACEi in the past and would be hesitant to start her on an ARB since Entresto contains an ARB and pt has been intolerant to 10/21/2020 -restart carvedilol 6.25 mg twice daily and take furosemide 40 mg daily as needed -counseled pt on importance of compliance -continue daily weight checks   HLD -follow up with PCP for restarting atorvastatin 20 mg daily   Pre-diabetes -03/26/2020 Hgb A1c 6.5 -pt reports BG has been running in the 120s -follow up with PCP for restarting metformin 500 mg BID -continue checking BG   Time spent: 10 minutes  03/17/2019, PharmD Pharmacy Resident  11/09/2020 9:14 AM     Current Outpatient Medications:    Ascorbic Acid (VITAMIN C WITH ROSE HIPS) 1000 MG tablet, Take 1,000 mg by mouth 3 (three) times daily., Disp: , Rfl:    atorvastatin (LIPITOR) 20 MG tablet, Take 20 mg by mouth daily. (Patient not taking: No sig reported), Disp: , Rfl:  carvedilol (COREG) 6.25 MG tablet, Take 1 tablet (6.25 mg total) by mouth 2 (two) times daily. (Patient not taking: No sig reported), Disp: 180 tablet, Rfl: 3   Cholecalciferol (VITAMIN D3) 10 MCG (400 UNIT) tablet, Take 2,000 Units by mouth daily., Disp: , Rfl:    furosemide (LASIX) 40 MG tablet, Take 1 tablet (40 mg total) by mouth 2 (two) times daily. (Patient not taking: No sig reported), Disp: 180 tablet, Rfl: 3   ipratropium-albuterol (DUONEB) 0.5-2.5 (3) MG/3ML SOLN, Take 3 mLs by nebulization 3 (three) times daily.  (Patient not taking: No sig reported), Disp: , Rfl:    metFORMIN (GLUCOPHAGE-XR)  500 MG 24 hr tablet, Take 500 mg by mouth 2 times daily at 12 noon and 4 pm. (Patient not taking: No sig reported), Disp: , Rfl:    omeprazole (PRILOSEC) 20 MG capsule, Take 20 mg by mouth as needed (acid reflux)., Disp: , Rfl:    potassium chloride (KLOR-CON) 10 MEQ tablet, Take 1 tablet (10 mEq total) by mouth daily., Disp: 90 tablet, Rfl: 3   sacubitril-valsartan (ENTRESTO) 49-51 MG, Take 0.5 tablets by mouth 2 (two) times daily., Disp: , Rfl:    topiramate (TOPAMAX) 50 MG tablet, Take 50 mg by mouth at bedtime.  (Patient not taking: No sig reported), Disp: , Rfl:    zinc gluconate 50 MG tablet, Take 50 mg by mouth daily., Disp: , Rfl:    DRUGS TO CAUTION IN HEART FAILURE  Drug or Class Mechanism  Analgesics NSAIDs COX-2 inhibitors Glucocorticoids  Sodium and water retention, increased systemic vascular resistance, decreased response to diuretics   Diabetes Medications Metformin Thiazolidinediones Rosiglitazone (Avandia) Pioglitazone (Actos) DPP4 Inhibitors Saxagliptin (Onglyza) Sitagliptin (Januvia)   Lactic acidosis Possible calcium channel blockade   Unknown  Antiarrhythmics Class I  Flecainide Disopyramide Class III Sotalol Other Dronedarone  Negative inotrope, proarrhythmic   Proarrhythmic, beta blockade  Negative inotrope  Antihypertensives Alpha Blockers Doxazosin Calcium Channel Blockers Diltiazem Verapamil Nifedipine Central Alpha Adrenergics Moxonidine Peripheral Vasodilators Minoxidil  Increases renin and aldosterone  Negative inotrope    Possible sympathetic withdrawal  Unknown  Anti-infective Itraconazole Amphotericin B  Negative inotrope Unknown  Hematologic Anagrelide Cilostazol   Possible inhibition of PD IV Inhibition of PD III causing arrhythmias  Neurologic/Psychiatric Stimulants Anti-Seizure  Drugs Carbamazepine Pregabalin Antidepressants Tricyclics Citalopram Parkinsons Bromocriptine Pergolide Pramipexole Antipsychotics Clozapine Antimigraine Ergotamine Methysergide Appetite suppressants Bipolar Lithium  Peripheral alpha and beta agonist activity  Negative inotrope and chronotrope Calcium channel blockade  Negative inotrope, proarrhythmic Dose-dependent QT prolongation  Excessive serotonin activity/valvular damage Excessive serotonin activity/valvular damage Unknown  IgE mediated hypersensitivy, calcium channel blockade  Excessive serotonin activity/valvular damage Excessive serotonin activity/valvular damage Valvular damage  Direct myofibrillar degeneration, adrenergic stimulation  Antimalarials Chloroquine Hydroxychloroquine Intracellular inhibition of lysosomal enzymes  Urologic Agents Alpha Blockers Doxazosin Prazosin Tamsulosin Terazosin  Increased renin and aldosterone  Adapted from Page Williemae Natter, et al. "Drugs That May Cause or Exacerbate Heart Failure: A Scientific Statement from the American Heart  Association." Circulation 2016; 134:e32-e69. DOI: 10.1161/CIR.0000000000000426   MEDICATION ADHERENCES TIPS AND STRATEGIES Taking medication as prescribed improves patient outcomes in heart failure (reduces hospitalizations, improves symptoms, increases survival) Side effects of medications can be managed by decreasing doses, switching agents, stopping drugs, or adding additional therapy. Please let someone in the Heart Failure Clinic know if you have having bothersome side effects so we can modify your regimen. Do not alter your medication regimen without talking to [place].  Medication reminders can help patients remember to take  drugs on time. If you are missing or forgetting doses you can try linking behaviors, using pill boxes, or an electronic reminder like an alarm on your phone or an app. Some people can also get automated phone calls as medication  reminders.

## 2020-11-09 NOTE — Progress Notes (Signed)
Patient ID: Pamela Kramer, female    DOB: [birth year], 64 y.o.   MRN: [id number]  HPI  Ms Pamela Kramer is a 65 y/o female with a history of hyperlipidemia, HTN, obesity and heart failure.   Echo report from 03/12/2019 which showed an EF of 35-40% along with mild MR/TR.  Stress test done 04/18/2019 showed a normal study with low risk and an EF of 55-65%.   Has not been admitted or been in the ED in the last 6 months.    She presents today for a follow-up visit with a chief complaint of moderate fatigue upon minimal exertion. She describes this as chronic in nature having been present for several years although has worsened since she resumed entresto. She has associated cough, shortness of breath, pedal edema (at the end of the day), intermittent abdominal pain, headaches, difficulty sleeping and slight weight gain along with this. She denies any dizziness, abdominal distention, palpitations or chest pain.   When she was off everything, she was feeling "great" and since resuming entresto at last visit all the above symptoms have returned.   Past Medical History:  Diagnosis Date   CHF (congestive heart failure) (HCC)    Hyperlipidemia    Hypertension    Pre-diabetes    Past Surgical History:  Procedure Laterality Date   TUBAL LIGATION     No family history on file. Social History   Tobacco Use   Smoking status: Never   Smokeless tobacco: Never  Substance Use Topics   Alcohol use: Yes    Alcohol/week: 1.0 standard drink    Types: 1 Glasses of wine per week   Allergies  Allergen Reactions   Excedrin Migraine  [Aspirin-Acetaminophen-Caffeine] Anaphylaxis   Lisinopril Cough   Prior to Admission medications   Medication Sig Start Date End Date Taking? Authorizing Provider  Ascorbic Acid (VITAMIN C WITH ROSE HIPS) 1000 MG tablet Take 1,000 mg by mouth 3 (three) times daily.   Yes [provider]  Cholecalciferol (VITAMIN D3) 10 MCG (400 UNIT) tablet Take 2,000 Units by mouth  daily.   Yes [provider]  omeprazole (PRILOSEC) 20 MG capsule Take 20 mg by mouth as needed (acid reflux).   Yes [provider]  potassium chloride (KLOR-CON) 10 MEQ tablet Take 1 tablet (10 mEq total) by mouth daily. 09/25/19  Yes Yoltzin Barg A, FNP  sacubitril-valsartan (ENTRESTO) 49-51 MG Take 0.5 tablets by mouth 2 (two) times daily.   Yes [provider]  zinc gluconate 50 MG tablet Take 50 mg by mouth daily.   Yes [provider]  atorvastatin (LIPITOR) 20 MG tablet Take 20 mg by mouth daily. Patient not taking: No sig reported    [provider]  carvedilol (COREG) 6.25 MG tablet Take 1 tablet (6.25 mg total) by mouth 2 (two) times daily. Patient not taking: No sig reported 08/25/19   Clarisa Kindred A, FNP  furosemide (LASIX) 40 MG tablet Take 1 tablet (40 mg total) by mouth 2 (two) times daily. Patient not taking: No sig reported 09/25/19   Clarisa Kindred A, FNP  ipratropium-albuterol (DUONEB) 0.5-2.5 (3) MG/3ML SOLN Take 3 mLs by nebulization 3 (three) times daily.  Patient not taking: No sig reported    [provider]  metFORMIN (GLUCOPHAGE-XR) 500 MG 24 hr tablet Take 500 mg by mouth 2 times daily at 12 noon and 4 pm. Patient not taking: No sig reported    [provider]  topiramate (TOPAMAX) 50 MG tablet Take  50 mg by mouth at bedtime.  Patient not taking: No sig reported    [provider]    Review of Systems  Constitutional:  Positive for fatigue (easily). Negative for appetite change.  HENT:  Negative for congestion, postnasal drip and sore throat.   Eyes: Negative.   Respiratory:  Positive for cough and shortness of breath.   Cardiovascular:  Positive for leg swelling (ankles at the end of the work day; resolves overnight). Negative for chest pain and palpitations.  Gastrointestinal:  Positive for abdominal pain (left sided pain intermittent) and nausea. Negative for abdominal distention.   Endocrine: Negative.   Genitourinary: Negative.   Musculoskeletal:  Negative for arthralgias and back pain.  Skin: Negative.   Allergic/Immunologic: Negative.   Neurological:  Positive for headaches (daily). Negative for dizziness and light-headedness.  Hematological:  Negative for adenopathy. Does not bruise/bleed easily.  Psychiatric/Behavioral:  Positive for sleep disturbance. Negative for dysphoric mood. The patient is not nervous/anxious.    Vitals:   11/09/20 0850  BP: (!) 144/98  Pulse: 76  Resp: 20  SpO2: 97%  Weight: (!) 314 lb 2 oz (142.5 kg)  Height: 5\' 6"  (1.676 m)   Wt Readings from Last 3 Encounters:  11/09/20 (!) 314 lb 2 oz (142.5 kg)  10/19/20 (!) 311 lb 8 oz (141.3 kg)  04/21/20 (!) 304 lb 8 oz (138.1 kg)   Lab Results  Component Value Date   CREATININE 0.64 10/19/2020   CREATININE 0.78 04/14/2019   CREATININE 0.78 03/15/2019    Physical Exam Vitals reviewed.  Constitutional:      Appearance: Normal appearance.  HENT:     Head: Normocephalic and atraumatic.  Cardiovascular:     Rate and Rhythm: Normal rate and regular rhythm.  Pulmonary:     Effort: Pulmonary effort is normal. No respiratory distress.     Breath sounds: No wheezing or rales.  Abdominal:     General: There is no distension.     Palpations: Abdomen is soft.  Musculoskeletal:        General: No tenderness.     Cervical back: Normal range of motion and neck supple.     Right lower leg: Edema (trace pitting) present.     Left lower leg: No edema.  Skin:    General: Skin is warm and dry.  Neurological:     General: No focal deficit present.     Mental Status: She is alert and oriented to person, place, and time.  Psychiatric:        Mood and Affect: Mood normal.        Behavior: Behavior normal.        Thought Content: Thought content normal.   Assessment & Plan:  1: Chronic heart failure with reduced ejection fraction- - NYHA class III - euvolemic today - weighing daily &  home weight chart reviewed; reminded to call for an overnight weight gain of >2 pounds or a weekly weight gain of >5 pounds - weight up 3 pounds from last visit 1 month ago - continues to be active at work  - not adding salt but and trying to be mindful of sodium intake although did eat some pizza recently with subsequent overnight weight gain of 4 pounds - saw cardiology 03/17/2019) 03/21/2019 - will stop entresto and have added it to her allergy list; will resume carvedilol 6.25mg  BID along with furosemide PRN - BNP 03/11/2019 was 381.0 - PharmD reconciled medications with the patient  2: HTN- -  BP mildly elevated today but she hasn't taken any of her medications yet  - home BP log reviewed  - saw PCP in [place] @ [facility] on 03/26/20; planning on changing PCP's - BMP 10/19/20 reviewed and showed sodium 142, potassium 3.8, creatinine 0.64 and GFR >60  3: Diabetes- - fasting glucose at home today was 152 - A1c 03/26/20 was 6.5%   Patient did not bring her medications nor a list. Each medication was verbally reviewed with the patient and she was encouraged to bring the bottles to every visit to confirm accuracy of list.   Return in 1 month or sooner for any questions/problems before then.

## 2020-12-19 NOTE — Progress Notes (Deleted)
Patient ID: Pamela Kramer, female    DOB: [birth year], 65 y.o.   MRN: [id number]  HPI  Pamela Kramer is a 65 y/o female with a history of hyperlipidemia, HTN, obesity and heart failure.   Echo report from 03/12/2019 which showed an EF of 35-40% along with mild MR/TR.  Stress test done 04/18/2019 showed a normal study with low risk and an EF of 55-65%.   Has not been admitted or been in the ED in the last 6 months.    She presents today for a follow-up visit with a chief complaint of   Past Medical History:  Diagnosis Date   CHF (congestive heart failure) (HCC)    Hyperlipidemia    Hypertension    Pre-diabetes    Past Surgical History:  Procedure Laterality Date   TUBAL LIGATION     No family history on file. Social History   Tobacco Use   Smoking status: Never   Smokeless tobacco: Never  Substance Use Topics   Alcohol use: Yes    Alcohol/week: 1.0 standard drink    Types: 1 Glasses of wine per week   Allergies  Allergen Reactions   Excedrin Migraine  [Aspirin-Acetaminophen-Caffeine] Anaphylaxis   Lisinopril Cough   Entresto [Sacubitril-Valsartan] Nausea Only, Other (See Comments) and Cough    insomnia     Review of Systems  Constitutional:  Positive for fatigue (easily). Negative for appetite change.  HENT:  Negative for congestion, postnasal drip and sore throat.   Eyes: Negative.   Respiratory:  Positive for cough and shortness of breath.   Cardiovascular:  Positive for leg swelling (ankles at the end of the work day; resolves overnight). Negative for chest pain and palpitations.  Gastrointestinal:  Positive for abdominal pain (left sided pain intermittent) and nausea. Negative for abdominal distention.  Endocrine: Negative.   Genitourinary: Negative.   Musculoskeletal:  Negative for arthralgias and back pain.  Skin: Negative.   Allergic/Immunologic: Negative.   Neurological:  Positive for headaches (daily). Negative for dizziness and light-headedness.   Hematological:  Negative for adenopathy. Does not bruise/bleed easily.  Psychiatric/Behavioral:  Positive for sleep disturbance. Negative for dysphoric mood. The patient is not nervous/anxious.       Physical Exam Vitals reviewed.  Constitutional:      Appearance: Normal appearance.  HENT:     Head: Normocephalic and atraumatic.  Cardiovascular:     Rate and Rhythm: Normal rate and regular rhythm.  Pulmonary:     Effort: Pulmonary effort is normal. No respiratory distress.     Breath sounds: No wheezing or rales.  Abdominal:     General: There is no distension.     Palpations: Abdomen is soft.  Musculoskeletal:        General: No tenderness.     Cervical back: Normal range of motion and neck supple.     Right lower leg: Edema (trace pitting) present.     Left lower leg: No edema.  Skin:    General: Skin is warm and dry.  Neurological:     General: No focal deficit present.     Mental Status: She is alert and oriented to person, place, and time.  Psychiatric:        Mood and Affect: Mood normal.        Behavior: Behavior normal.        Thought Content: Thought content normal.   Assessment & Plan:  1: Chronic heart failure with reduced ejection fraction- - NYHA class III - euvolemic  today - weighing daily & home weight chart reviewed; reminded to call for an overnight weight gain of >2 pounds or a weekly weight gain of >5 pounds - weight 314.2 pounds from last visit 6 weeks ago - continues to be active at work  - not adding salt but and trying to be mindful of sodium intake although did eat some pizza recently with subsequent overnight weight gain of 4 pounds - saw cardiology Juliann Pares) 03/21/2019 - carvedilol 6.25mg  BID along with furosemide PRN resumed at last visit - BNP 03/11/2019 was 381.0   2: HTN- - BP t  - home BP log reviewed  - saw PCP in [place] @ [facility] on 03/26/20; planning on changing PCP's - BMP 10/19/20 reviewed and showed sodium 142,  potassium 3.8, creatinine 0.64 and GFR >60  3: Diabetes- - fasting glucose at home today was  - A1c 03/26/20 was 6.5%   Patient did not bring her medications nor a list. Each medication was verbally reviewed with the patient and she was encouraged to bring the bottles to every visit to confirm accuracy of list.

## 2020-12-20 ENCOUNTER — Ambulatory Visit: Payer: Medicare HMO | Admitting: Family

## 2021-04-06 ENCOUNTER — Encounter: Payer: Self-pay | Admitting: Family

## 2021-04-06 ENCOUNTER — Ambulatory Visit: Payer: Self-pay | Attending: Family | Admitting: Family

## 2021-04-06 ENCOUNTER — Other Ambulatory Visit: Payer: Self-pay

## 2021-04-06 VITALS — BP 157/78 | HR 91 | Resp 20 | Ht 66.0 in | Wt 306.2 lb

## 2021-04-06 DIAGNOSIS — E669 Obesity, unspecified: Secondary | ICD-10-CM | POA: Insufficient documentation

## 2021-04-06 DIAGNOSIS — E785 Hyperlipidemia, unspecified: Secondary | ICD-10-CM | POA: Insufficient documentation

## 2021-04-06 DIAGNOSIS — I110 Hypertensive heart disease with heart failure: Secondary | ICD-10-CM | POA: Insufficient documentation

## 2021-04-06 DIAGNOSIS — I5022 Chronic systolic (congestive) heart failure: Secondary | ICD-10-CM | POA: Insufficient documentation

## 2021-04-06 DIAGNOSIS — E119 Type 2 diabetes mellitus without complications: Secondary | ICD-10-CM | POA: Insufficient documentation

## 2021-04-06 DIAGNOSIS — I10 Essential (primary) hypertension: Secondary | ICD-10-CM

## 2021-04-06 NOTE — Progress Notes (Signed)
Patient ID: Pamela Kramer, female    DOB: [birth year], 65 y.o.   MRN: [id number]   Pamela Kramer is a 65 y/o female with a history of hyperlipidemia, HTN, obesity and heart failure.   Echo report from 03/12/2019 which showed an EF of 35-40% along with mild MR/TR.  Stress test done 04/18/2019 showed a normal study with low risk and an EF of 55-65%.   Has not been admitted or been in the ED in the last 6 months.    She presents today for a follow-up visit with a chief complaint of minimal fatigue upon moderate exertion. She advises that she feels better than she has in recent months and attributes this to stopping all of her prescription medications. She denies CP, SOB, palpitations, cough, orthopnea, syncope or presyncope.   She is currently taking vitamin D3, vitamin C, magnesium, and vitamin B12. She checks her BP, weight and BS daily and keeps a detailed log. Her BP readings are consistently 150'2/90-100's systolic. Her weight is consistently 303-306. Her BS is usually 120-130. She reports that she has not seen a PCP or cardiologist in > year. She has an upcoming appointment with a holistic physician next week.   Past Medical History:  Diagnosis Date   CHF (congestive heart failure) (HCC)    Hyperlipidemia    Hypertension    Pre-diabetes    Past Surgical History:  Procedure Laterality Date   TUBAL LIGATION     No family history on file. Social History   Tobacco Use   Smoking status: Never   Smokeless tobacco: Never  Substance Use Topics   Alcohol use: Yes    Alcohol/week: 1.0 standard drink    Types: 1 Glasses of wine per week   Allergies  Allergen Reactions   Excedrin Migraine  [Aspirin-Acetaminophen-Caffeine] Anaphylaxis   Lisinopril Cough   Entresto [Sacubitril-Valsartan] Nausea Only, Other (See Comments) and Cough    insomnia   Prior to Admission medications   Medication Sig Start Date End Date Taking? Authorizing Provider  Cholecalciferol (VITAMIN D3) 10 MCG (400  UNIT) tablet Take 2,000 Units by mouth daily.   Yes [provider]  Ascorbic Acid (VITAMIN C WITH ROSE HIPS) 1000 MG tablet Take 1,000 mg by mouth 3 (three) times daily. Patient not taking: Reported on 04/06/2021    [provider]  atorvastatin (LIPITOR) 20 MG tablet Take 20 mg by mouth daily. Patient not taking: No sig reported    [provider]  carvedilol (COREG) 6.25 MG tablet Take 1 tablet (6.25 mg total) by mouth 2 (two) times daily. Patient not taking: No sig reported 08/25/19   Clarisa Kindred A, FNP  furosemide (LASIX) 40 MG tablet Take 1 tablet (40 mg total) by mouth 2 (two) times daily. Patient not taking: No sig reported 09/25/19   Clarisa Kindred A, FNP  ipratropium-albuterol (DUONEB) 0.5-2.5 (3) MG/3ML SOLN Take 3 mLs by nebulization 3 (three) times daily.  Patient not taking: No sig reported    [provider]  metFORMIN (GLUCOPHAGE-XR) 500 MG 24 hr tablet Take 500 mg by mouth 2 times daily at 12 noon and 4 pm. Patient not taking: No sig reported    [provider]  omeprazole (PRILOSEC) 20 MG capsule Take 20 mg by mouth as needed (acid reflux). Patient not taking: Reported on 04/06/2021    [provider]  potassium chloride (KLOR-CON) 10 MEQ tablet Take 1 tablet (10 mEq total) by mouth daily. Patient not taking: Reported on 04/06/2021 09/25/19  Clarisa Kindred A, FNP  topiramate (TOPAMAX) 50 MG tablet Take 50 mg by mouth at bedtime.  Patient not taking: No sig reported    [provider]  zinc gluconate 50 MG tablet Take 50 mg by mouth daily. Patient not taking: Reported on 04/06/2021    [provider]    Review of Systems  Constitutional:  Positive for fatigue. Negative for appetite change.  HENT:  Negative for congestion, postnasal drip and sore throat.   Eyes: Negative.   Respiratory:  Negative for cough and shortness of breath.   Cardiovascular:  Positive for leg swelling (resolves overnight). Negative  for chest pain and palpitations.  Gastrointestinal:  Negative for abdominal distention, abdominal pain (left sided pain intermittent) and nausea.  Endocrine: Negative for polyuria.  Genitourinary: Negative.   Musculoskeletal:  Negative for arthralgias and back pain.  Skin: Negative.   Allergic/Immunologic: Negative.   Neurological:  Negative for dizziness, light-headedness and headaches (daily).  Hematological:  Negative for adenopathy. Does not bruise/bleed easily.  Psychiatric/Behavioral:  Negative for dysphoric mood and sleep disturbance. The patient is not nervous/anxious.    Vitals:   04/06/21 1522  BP: (!) 157/78  Pulse: 91  Resp: 20  SpO2: 98%  Weight: (!) 306 lb 3 oz (138.9 kg)  Height: 5\' 6"  (1.676 m)   Wt Readings from Last 3 Encounters:  04/06/21 (!) 306 lb 3 oz (138.9 kg)  11/09/20 (!) 314 lb 2 oz (142.5 kg)  10/19/20 (!) 311 lb 8 oz (141.3 kg)   Lab Results  Component Value Date   CREATININE 0.64 10/19/2020   CREATININE 0.78 04/14/2019   CREATININE 0.78 03/15/2019    Physical Exam Vitals reviewed.  Constitutional:      General: She is not in acute distress.    Appearance: Normal appearance. She is obese. She is not ill-appearing.  HENT:     Head: Normocephalic and atraumatic.  Neck:     Vascular: No carotid bruit.  Cardiovascular:     Rate and Rhythm: Normal rate and regular rhythm.     Heart sounds: Normal heart sounds.  Pulmonary:     Effort: Pulmonary effort is normal. No respiratory distress.     Breath sounds: No wheezing or rales.  Abdominal:     General: There is no distension.     Palpations: Abdomen is soft.  Musculoskeletal:        General: No tenderness.     Cervical back: Normal range of motion and neck supple.     Right lower leg: Edema (+2) present.     Left lower leg: Edema (+2) present.  Skin:    General: Skin is warm and dry.  Neurological:     General: No focal deficit present.     Mental Status: She is alert and oriented to  person, place, and time.  Psychiatric:        Mood and Affect: Mood normal.        Behavior: Behavior normal.        Thought Content: Thought content normal.   Assessment & Plan:  1: Chronic heart failure with reduced ejection fraction- - NYHA class II - euvolemic today - weighing daily & home weight chart reviewed; reminded to call for an overnight weight gain of >2 pounds or a weekly weight gain of >5 pounds - weight down 8 lbs from last visit in June - continues to be active at work - has discontinued all of her prescription medications - not adding salt  but and trying to be mindful of sodium  - saw cardiology Juliann Pares) 03/21/2019 - BNP 03/11/2019 was 381.0  2: HTN- - BP elevated, 157/78 - home BP log reviewed and her readings are consistently elevated - pt is aware that they are elevated and wants to continue to keep an eye on it; she is hopeful that when she meets with new holistic MD that they will have some alternative ways to manage her BP - BMP 10/19/20 reviewed and showed sodium 142, potassium 3.8, creatinine 0.64 and GFR >60  3: Diabetes- - checks daily, usually between 120-130 - A1c 03/26/20 was 6.5%  No follow up appointment made. Advised she can call if needed for any HF symptoms.

## 2021-09-30 IMAGING — DX DG CHEST 1V
1 series · 1 of 1 positions shown · non-contrast
Comparison: 03/11/2019

CLINICAL DATA: CHF exacerbation

EXAM:
CHEST  1 VIEW

[chest ap]
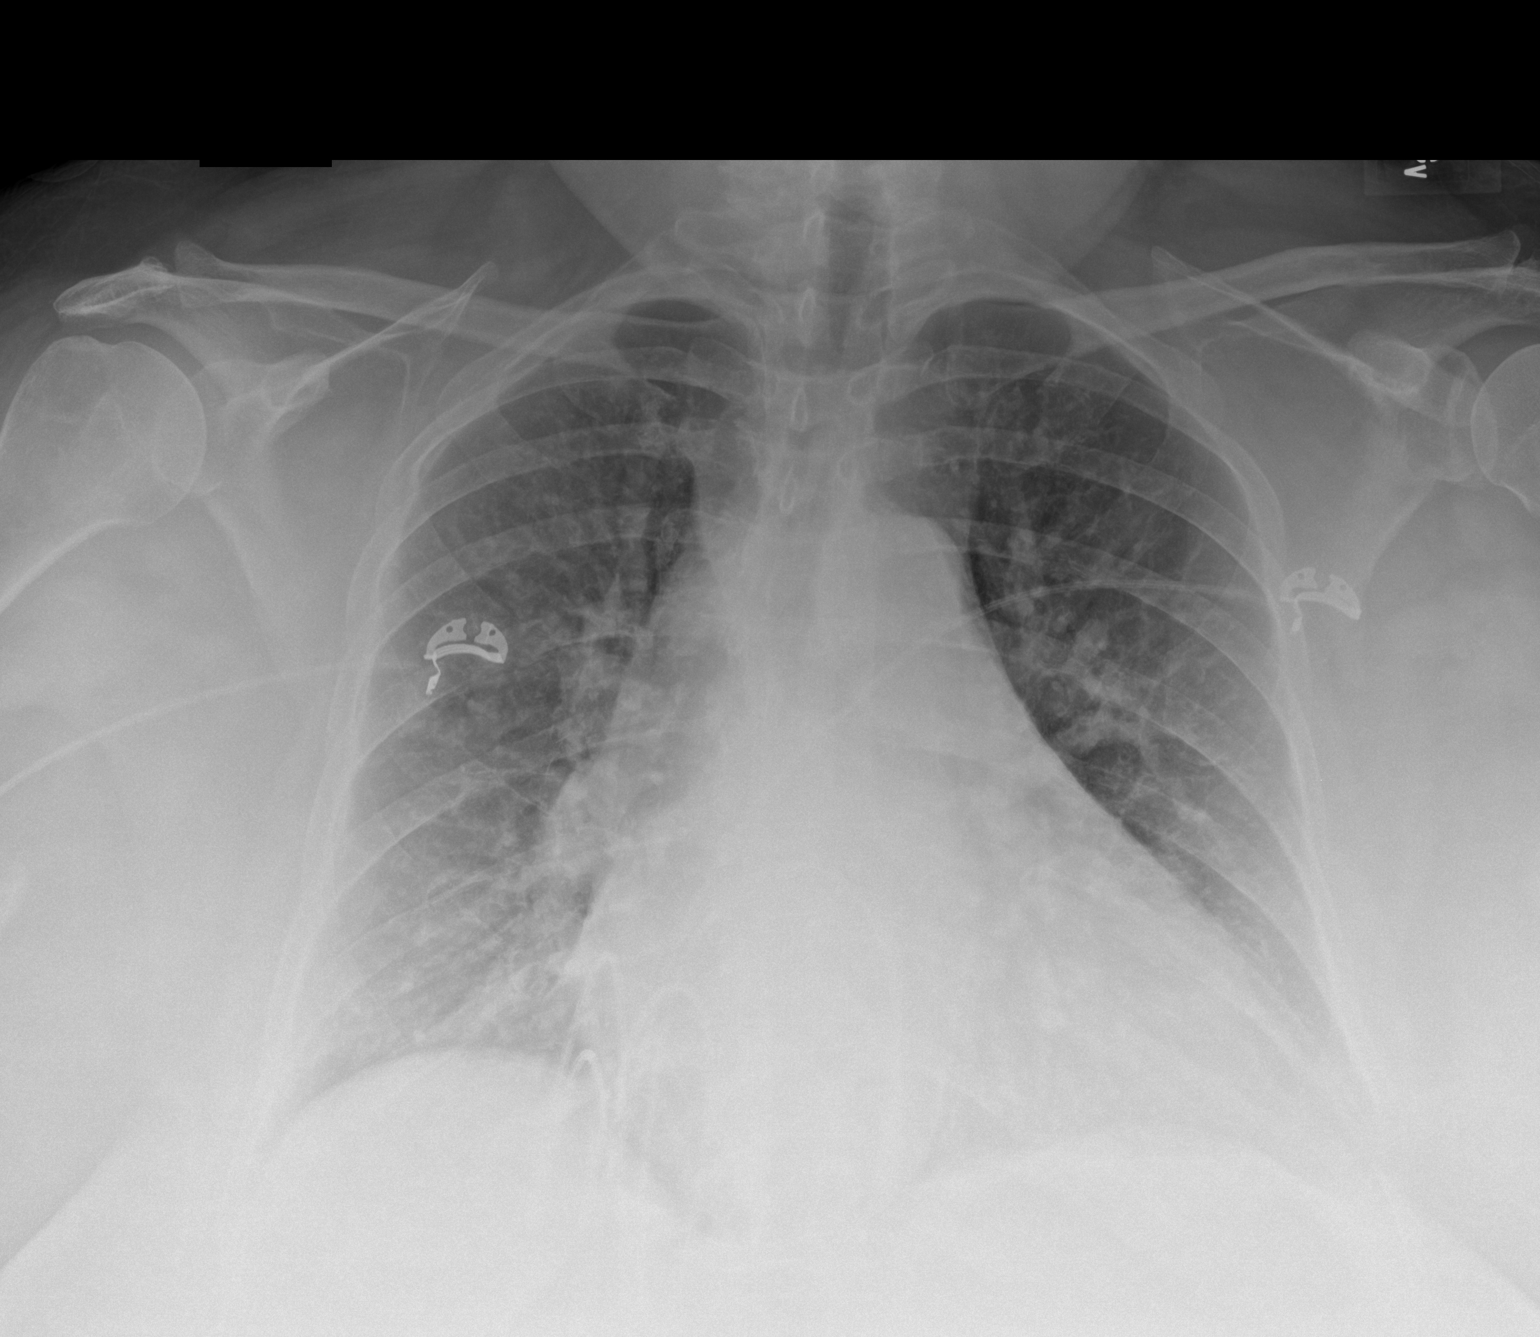

[1 of 1 positions shown; findings below may reference images not displayed]

FINDINGS: Cardiomegaly with pulmonary vascular congestion. No frank
interstitial edema. No definite pleural effusions. No pneumothorax.
IMPRESSION: Cardiomegaly with pulmonary vascular congestion. No frank
interstitial edema.

## 2022-05-23 ENCOUNTER — Other Ambulatory Visit: Payer: Self-pay

## 2022-05-23 ENCOUNTER — Emergency Department: Payer: Medicare Other

## 2022-05-23 DIAGNOSIS — Z886 Allergy status to analgesic agent status: Secondary | ICD-10-CM

## 2022-05-23 DIAGNOSIS — Z888 Allergy status to other drugs, medicaments and biological substances status: Secondary | ICD-10-CM

## 2022-05-23 DIAGNOSIS — E785 Hyperlipidemia, unspecified: Secondary | ICD-10-CM | POA: Diagnosis present

## 2022-05-23 DIAGNOSIS — J9601 Acute respiratory failure with hypoxia: Secondary | ICD-10-CM | POA: Diagnosis present

## 2022-05-23 DIAGNOSIS — I152 Hypertension secondary to endocrine disorders: Secondary | ICD-10-CM | POA: Diagnosis present

## 2022-05-23 DIAGNOSIS — I959 Hypotension, unspecified: Secondary | ICD-10-CM | POA: Diagnosis present

## 2022-05-23 DIAGNOSIS — J101 Influenza due to other identified influenza virus with other respiratory manifestations: Secondary | ICD-10-CM | POA: Diagnosis present

## 2022-05-23 DIAGNOSIS — Z7984 Long term (current) use of oral hypoglycemic drugs: Secondary | ICD-10-CM

## 2022-05-23 DIAGNOSIS — Z1152 Encounter for screening for COVID-19: Secondary | ICD-10-CM

## 2022-05-23 DIAGNOSIS — Z6841 Body Mass Index (BMI) 40.0 and over, adult: Secondary | ICD-10-CM

## 2022-05-23 DIAGNOSIS — A4189 Other specified sepsis: Secondary | ICD-10-CM | POA: Diagnosis not present

## 2022-05-23 DIAGNOSIS — I420 Dilated cardiomyopathy: Secondary | ICD-10-CM | POA: Diagnosis present

## 2022-05-23 DIAGNOSIS — Z79899 Other long term (current) drug therapy: Secondary | ICD-10-CM

## 2022-05-23 DIAGNOSIS — E1169 Type 2 diabetes mellitus with other specified complication: Secondary | ICD-10-CM | POA: Diagnosis present

## 2022-05-23 DIAGNOSIS — J1000 Influenza due to other identified influenza virus with unspecified type of pneumonia: Secondary | ICD-10-CM | POA: Diagnosis present

## 2022-05-23 DIAGNOSIS — I5023 Acute on chronic systolic (congestive) heart failure: Secondary | ICD-10-CM | POA: Diagnosis not present

## 2022-05-23 LAB — CBC WITH DIFFERENTIAL/PLATELET
Abs Immature Granulocytes: 0.05 10*3/uL (ref 0.00–0.07)
Basophils Absolute: 0 10*3/uL (ref 0.0–0.1)
Basophils Relative: 0 %
Eosinophils Absolute: 0 10*3/uL (ref 0.0–0.5)
Eosinophils Relative: 0 %
HCT: 45.1 % (ref 36.0–46.0)
Hemoglobin: 14.7 g/dL (ref 12.0–15.0)
Immature Granulocytes: 0 %
Lymphocytes Relative: 6 %
Lymphs Abs: 0.8 10*3/uL (ref 0.7–4.0)
MCH: 27.9 pg (ref 26.0–34.0)
MCHC: 32.6 g/dL (ref 30.0–36.0)
MCV: 85.7 fL (ref 80.0–100.0)
Monocytes Absolute: 0.7 10*3/uL (ref 0.1–1.0)
Monocytes Relative: 5 %
Neutro Abs: 13.4 10*3/uL — ABNORMAL HIGH (ref 1.7–7.7)
Neutrophils Relative %: 89 %
Platelets: 229 10*3/uL (ref 150–400)
RBC: 5.26 MIL/uL — ABNORMAL HIGH (ref 3.87–5.11)
RDW: 13.8 % (ref 11.5–15.5)
WBC: 15 10*3/uL — ABNORMAL HIGH (ref 4.0–10.5)
nRBC: 0 % (ref 0.0–0.2)

## 2022-05-23 LAB — RESP PANEL BY RT-PCR (RSV, FLU A&B, COVID)  RVPGX2
Influenza A by PCR: POSITIVE — AB
Influenza B by PCR: NEGATIVE
Resp Syncytial Virus by PCR: NEGATIVE
SARS Coronavirus 2 by RT PCR: NEGATIVE

## 2022-05-23 LAB — BASIC METABOLIC PANEL
Anion gap: 11 (ref 5–15)
BUN: 8 mg/dL (ref 8–23)
CO2: 24 mmol/L (ref 22–32)
Calcium: 8.9 mg/dL (ref 8.9–10.3)
Chloride: 102 mmol/L (ref 98–111)
Creatinine, Ser: 0.68 mg/dL (ref 0.44–1.00)
GFR, Estimated: >60 mL/min (ref >60)
Glucose, Bld: 132 mg/dL — ABNORMAL HIGH (ref 70–99)
Potassium: 3.3 mmol/L — ABNORMAL LOW (ref 3.5–5.1)
Sodium: 137 mmol/L (ref 135–145)

## 2022-05-23 LAB — TROPONIN I (HIGH SENSITIVITY): Troponin I (High Sensitivity): 53 ng/L — ABNORMAL HIGH (ref <18)

## 2022-05-23 NOTE — ED Triage Notes (Signed)
Patient reports husband has the flu and she is having sob and cough along with fever. No fever here but reports fever at home.  Worsening sob with exertion hx of afib. Also complains of dizziness and weakness.

## 2022-05-23 NOTE — ED Provider Triage Note (Signed)
Emergency Medicine Provider Triage Evaluation Note  Unity Luepke , a 66 y.o. female  was evaluated in triage.  Pt complains of cough, SOB, fever since last week but worse today. Husband has the flu.  Review of Systems  Positive: SOB, fever, cough Negative: Abd pain, n/v/d  Physical Exam  BP 112/83 (BP Location: Left Arm)   Pulse (!) 113   Temp 99.8 F (37.7 C) (Oral)   Resp 20   Ht 5\' 6"  (1.676 m)   Wt (!) 138.8 kg   SpO2 92%   BMI 49.39 kg/m  Gen:   Awake, no distress   Resp:  Normal effort  MSK:   Moves extremities without difficulty  Other:    Medical Decision Making  Medically screening exam initiated at 5:32 PM.  Appropriate orders placed.  Jerlean Peralta was informed that the remainder of the evaluation will be completed by another provider, this initial triage assessment does not replace that evaluation, and the importance of remaining in the ED until their evaluation is complete.     Kerrie Pleasure, PA-C 05/23/22 1733

## 2022-05-24 ENCOUNTER — Observation Stay
Admit: 2022-05-24 | Discharge: 2022-05-24 | Disposition: A | Discharge status: Home / Self Care | Payer: Medicare Other | Attending: Internal Medicine | Admitting: Internal Medicine

## 2022-05-24 ENCOUNTER — Inpatient Hospital Stay
Admission: EM | Admit: 2022-05-24 | Discharge: 2022-05-28 | DRG: 871 | Disposition: A | Discharge status: Home / Self Care | Payer: Medicare Other | Attending: Internal Medicine | Admitting: Internal Medicine

## 2022-05-24 DIAGNOSIS — I959 Hypotension, unspecified: Secondary | ICD-10-CM | POA: Diagnosis present

## 2022-05-24 DIAGNOSIS — J09X1 Influenza due to identified novel influenza A virus with pneumonia: Secondary | ICD-10-CM | POA: Diagnosis present

## 2022-05-24 DIAGNOSIS — J9601 Acute respiratory failure with hypoxia: Secondary | ICD-10-CM | POA: Diagnosis present

## 2022-05-24 DIAGNOSIS — I5023 Acute on chronic systolic (congestive) heart failure: Secondary | ICD-10-CM | POA: Diagnosis not present

## 2022-05-24 DIAGNOSIS — E1169 Type 2 diabetes mellitus with other specified complication: Secondary | ICD-10-CM | POA: Diagnosis present

## 2022-05-24 DIAGNOSIS — R7989 Other specified abnormal findings of blood chemistry: Secondary | ICD-10-CM | POA: Diagnosis present

## 2022-05-24 DIAGNOSIS — J189 Pneumonia, unspecified organism: Secondary | ICD-10-CM

## 2022-05-24 DIAGNOSIS — J101 Influenza due to other identified influenza virus with other respiratory manifestations: Secondary | ICD-10-CM | POA: Diagnosis present

## 2022-05-24 DIAGNOSIS — A419 Sepsis, unspecified organism: Secondary | ICD-10-CM | POA: Diagnosis present

## 2022-05-24 DIAGNOSIS — Z79899 Other long term (current) drug therapy: Secondary | ICD-10-CM | POA: Diagnosis not present

## 2022-05-24 DIAGNOSIS — R6510 Systemic inflammatory response syndrome (SIRS) of non-infectious origin without acute organ dysfunction: Secondary | ICD-10-CM

## 2022-05-24 DIAGNOSIS — I5022 Chronic systolic (congestive) heart failure: Secondary | ICD-10-CM | POA: Diagnosis present

## 2022-05-24 DIAGNOSIS — E1159 Type 2 diabetes mellitus with other circulatory complications: Secondary | ICD-10-CM | POA: Diagnosis present

## 2022-05-24 DIAGNOSIS — J1000 Influenza due to other identified influenza virus with unspecified type of pneumonia: Secondary | ICD-10-CM | POA: Diagnosis present

## 2022-05-24 DIAGNOSIS — E119 Type 2 diabetes mellitus without complications: Secondary | ICD-10-CM

## 2022-05-24 DIAGNOSIS — E66813 Obesity, class 3: Secondary | ICD-10-CM | POA: Diagnosis present

## 2022-05-24 DIAGNOSIS — E785 Hyperlipidemia, unspecified: Secondary | ICD-10-CM | POA: Diagnosis present

## 2022-05-24 DIAGNOSIS — A4189 Other specified sepsis: Secondary | ICD-10-CM | POA: Diagnosis present

## 2022-05-24 DIAGNOSIS — I420 Dilated cardiomyopathy: Secondary | ICD-10-CM | POA: Diagnosis present

## 2022-05-24 DIAGNOSIS — Z886 Allergy status to analgesic agent status: Secondary | ICD-10-CM | POA: Diagnosis not present

## 2022-05-24 DIAGNOSIS — Z1152 Encounter for screening for COVID-19: Secondary | ICD-10-CM | POA: Diagnosis not present

## 2022-05-24 DIAGNOSIS — Z6841 Body Mass Index (BMI) 40.0 and over, adult: Secondary | ICD-10-CM | POA: Diagnosis not present

## 2022-05-24 DIAGNOSIS — I152 Hypertension secondary to endocrine disorders: Secondary | ICD-10-CM | POA: Diagnosis present

## 2022-05-24 DIAGNOSIS — Z888 Allergy status to other drugs, medicaments and biological substances status: Secondary | ICD-10-CM | POA: Diagnosis not present

## 2022-05-24 DIAGNOSIS — Z7984 Long term (current) use of oral hypoglycemic drugs: Secondary | ICD-10-CM | POA: Diagnosis not present

## 2022-05-24 LAB — CORTISOL-AM, BLOOD: Cortisol - AM: 41.2 ug/dL — ABNORMAL HIGH (ref 6.7–22.6)

## 2022-05-24 LAB — CREATININE, SERUM
Creatinine, Ser: 0.7 mg/dL (ref 0.44–1.00)
GFR, Estimated: >60 mL/min (ref >60)

## 2022-05-24 LAB — CBC
HCT: 44.3 % (ref 36.0–46.0)
Hemoglobin: 14.2 g/dL (ref 12.0–15.0)
MCH: 27.9 pg (ref 26.0–34.0)
MCHC: 32.1 g/dL (ref 30.0–36.0)
MCV: 87 fL (ref 80.0–100.0)
Platelets: 240 10*3/uL (ref 150–400)
RBC: 5.09 MIL/uL (ref 3.87–5.11)
RDW: 13.8 % (ref 11.5–15.5)
WBC: 13.3 10*3/uL — ABNORMAL HIGH (ref 4.0–10.5)
nRBC: 0 % (ref 0.0–0.2)

## 2022-05-24 LAB — CBG MONITORING, ED
Glucose-Capillary: 123 mg/dL — ABNORMAL HIGH (ref 70–99)
Glucose-Capillary: 140 mg/dL — ABNORMAL HIGH (ref 70–99)
Glucose-Capillary: 128 mg/dL — ABNORMAL HIGH (ref 70–99)
Glucose-Capillary: 148 mg/dL — ABNORMAL HIGH (ref 70–99)
Glucose-Capillary: 88 mg/dL (ref 70–99)

## 2022-05-24 LAB — BLOOD GAS, VENOUS
Acid-Base Excess: 6.6 mmol/L — ABNORMAL HIGH (ref 0.0–2.0)
Bicarbonate: 32.4 mmol/L — ABNORMAL HIGH (ref 20.0–28.0)
O2 Saturation: 93.6 %
Patient temperature: 37
pCO2, Ven: 50 mmHg (ref 44–60)
pH, Ven: 7.42 (ref 7.25–7.43)
pO2, Ven: 67 mmHg — ABNORMAL HIGH (ref 32–45)

## 2022-05-24 LAB — URINALYSIS, ROUTINE W REFLEX MICROSCOPIC
Bacteria, UA: NONE SEEN
Bilirubin Urine: NEGATIVE
Glucose, UA: NEGATIVE mg/dL
Hgb urine dipstick: NEGATIVE
Ketones, ur: NEGATIVE mg/dL
Leukocytes,Ua: NEGATIVE
Nitrite: NEGATIVE
Protein, ur: 100 mg/dL — AB
Specific Gravity, Urine: 1.021 (ref 1.005–1.030)
pH: 6 (ref 5.0–8.0)

## 2022-05-24 LAB — TROPONIN I (HIGH SENSITIVITY): Troponin I (High Sensitivity): 61 ng/L — ABNORMAL HIGH (ref <18)

## 2022-05-24 LAB — PROCALCITONIN: Procalcitonin: <0.1 ng/mL

## 2022-05-24 LAB — BRAIN NATRIURETIC PEPTIDE: B Natriuretic Peptide: 825.3 pg/mL — ABNORMAL HIGH (ref 0.0–100.0)

## 2022-05-24 LAB — HIV ANTIBODY (ROUTINE TESTING W REFLEX): HIV Screen 4th Generation wRfx: NONREACTIVE

## 2022-05-24 LAB — PROTIME-INR
INR: 1.1 (ref 0.8–1.2)
Prothrombin Time: 14.1 seconds (ref 11.4–15.2)

## 2022-05-24 MED ORDER — LORAZEPAM 2 MG/ML IJ SOLN
0.5000 mg | Freq: Once | INTRAMUSCULAR | Status: AC
  Administered 2022-05-24: 0.5 mg via INTRAVENOUS
  Filled 2022-05-24: qty 1

## 2022-05-24 MED ORDER — FUROSEMIDE 10 MG/ML IJ SOLN
40.0000 mg | Freq: Once | INTRAMUSCULAR | Status: AC
  Administered 2022-05-24: 40 mg via INTRAVENOUS
  Filled 2022-05-24: qty 4

## 2022-05-24 MED ORDER — GUAIFENESIN ER 600 MG PO TB12
600.0000 mg | ORAL_TABLET | Freq: Two times a day (BID) | ORAL | Status: DC
  Administered 2022-05-24 – 2022-05-28 (×9): 600 mg via ORAL
  Filled 2022-05-24 (×10): qty 1

## 2022-05-24 MED ORDER — HYDROCODONE-ACETAMINOPHEN 5-325 MG PO TABS
1.0000 | ORAL_TABLET | ORAL | Status: DC | PRN
  Administered 2022-05-25 – 2022-05-27 (×2): 1 via ORAL
  Administered 2022-05-28: 2 via ORAL
  Administered 2022-05-28: 1 via ORAL
  Filled 2022-05-24 (×3): qty 1
  Filled 2022-05-24: qty 2

## 2022-05-24 MED ORDER — ONDANSETRON HCL 4 MG PO TABS: 4.0000 mg | ORAL_TABLET | Freq: Four times a day (QID) | ORAL | Status: DC | PRN

## 2022-05-24 MED ORDER — ENOXAPARIN SODIUM 80 MG/0.8ML IJ SOSY
0.5000 mg/kg | PREFILLED_SYRINGE | INTRAMUSCULAR | Status: DC
  Administered 2022-05-25 – 2022-05-27 (×3): 70 mg via SUBCUTANEOUS
  Filled 2022-05-24 (×6): qty 0.7

## 2022-05-24 MED ORDER — GUAIFENESIN-DM 100-10 MG/5ML PO SYRP
5.0000 mL | ORAL_SOLUTION | ORAL | Status: DC | PRN
  Administered 2022-05-25 – 2022-05-28 (×3): 5 mL via ORAL
  Filled 2022-05-24 (×3): qty 10

## 2022-05-24 MED ORDER — ALBUTEROL SULFATE (2.5 MG/3ML) 0.083% IN NEBU
2.5000 mg | INHALATION_SOLUTION | Freq: Four times a day (QID) | RESPIRATORY_TRACT | Status: DC
  Administered 2022-05-24 – 2022-05-27 (×13): 2.5 mg via RESPIRATORY_TRACT
  Filled 2022-05-24 (×13): qty 3

## 2022-05-24 MED ORDER — ACETAMINOPHEN 325 MG PO TABS
650.0000 mg | ORAL_TABLET | Freq: Four times a day (QID) | ORAL | Status: DC | PRN
  Administered 2022-05-26 – 2022-05-27 (×5): 650 mg via ORAL
  Filled 2022-05-24 (×6): qty 2

## 2022-05-24 MED ORDER — OSELTAMIVIR PHOSPHATE 75 MG PO CAPS
75.0000 mg | ORAL_CAPSULE | Freq: Two times a day (BID) | ORAL | Status: DC
  Administered 2022-05-24 – 2022-05-28 (×8): 75 mg via ORAL
  Filled 2022-05-24 (×9): qty 1

## 2022-05-24 MED ORDER — SODIUM CHLORIDE 0.9 % IV SOLN
500.0000 mg | Freq: Once | INTRAVENOUS | Status: AC
  Administered 2022-05-24: 500 mg via INTRAVENOUS
  Filled 2022-05-24: qty 5

## 2022-05-24 MED ORDER — SODIUM CHLORIDE 0.9 % IV SOLN
500.0000 mg | INTRAVENOUS | Status: DC
  Administered 2022-05-24: 500 mg via INTRAVENOUS
  Filled 2022-05-24: qty 5

## 2022-05-24 MED ORDER — ONDANSETRON HCL 4 MG/2ML IJ SOLN
4.0000 mg | Freq: Four times a day (QID) | INTRAMUSCULAR | Status: DC | PRN
  Administered 2022-05-25: 4 mg via INTRAVENOUS
  Filled 2022-05-24: qty 2

## 2022-05-24 MED ORDER — ACETAMINOPHEN 650 MG RE SUPP: 650.0000 mg | Freq: Four times a day (QID) | RECTAL | Status: DC | PRN

## 2022-05-24 MED ORDER — SODIUM CHLORIDE 0.9 % IV SOLN
2.0000 g | Freq: Once | INTRAVENOUS | Status: AC
  Administered 2022-05-24: 2 g via INTRAVENOUS
  Filled 2022-05-24: qty 20

## 2022-05-24 MED ORDER — LACTATED RINGERS IV SOLN: INTRAVENOUS | Status: DC

## 2022-05-24 MED ORDER — INSULIN ASPART 100 UNIT/ML IJ SOLN
0.0000 [IU] | Freq: Three times a day (TID) | INTRAMUSCULAR | Status: DC
  Administered 2022-05-24 – 2022-05-27 (×5): 3 [IU] via SUBCUTANEOUS
  Filled 2022-05-24 (×5): qty 1

## 2022-05-24 MED ORDER — INSULIN ASPART 100 UNIT/ML IJ SOLN: 0.0000 [IU] | Freq: Every day | INTRAMUSCULAR | Status: DC

## 2022-05-24 MED ORDER — LACTATED RINGERS IV BOLUS (SEPSIS)
1000.0000 mL | Freq: Once | INTRAVENOUS | Status: AC
  Administered 2022-05-24: 1000 mL via INTRAVENOUS

## 2022-05-24 MED ORDER — SODIUM CHLORIDE 0.9 % IV SOLN
2.0000 g | INTRAVENOUS | Status: DC
  Administered 2022-05-24: 2 g via INTRAVENOUS
  Filled 2022-05-24: qty 20

## 2022-05-24 MED ORDER — ASPIRIN 81 MG PO CHEW
324.0000 mg | CHEWABLE_TABLET | Freq: Once | ORAL | Status: AC
  Administered 2022-05-24: 324 mg via ORAL
  Filled 2022-05-24: qty 4

## 2022-05-24 NOTE — ED Notes (Signed)
Requested NT find bedside commode if any available for pt to use at bedside. NT found and brought pt a bedside commode.

## 2022-05-24 NOTE — ED Notes (Signed)
Echo staff at bedside.

## 2022-05-24 NOTE — ED Notes (Signed)
Dietary staff dropped pt's lunch tray off.

## 2022-05-24 NOTE — Assessment & Plan Note (Addendum)
Estimated body mass index is 49.39 kg/m as calculated from the following:   Height as of this encounter: 5\' 6"  (1.676 m).   Weight as of this encounter: 138.8 kg.   Complicating factor to overall prognosis and care

## 2022-05-24 NOTE — Hospital Course (Addendum)
Taken from H&P.  Pamela Kramer is a 66 y.o. female with medical history significant for HTN, HFrEF,diabetes, class III obesity who presents to the ED with a several day history of flulike symptoms of cough, shortness of breath and fever.  She has become increasingly dyspneic with inability to walk from the bed to the doorway without becoming extremely short of breath.  Her husband is currently hospitalized in ICU with influenza. ED course and data review: Tmax 99.8 with pulse 113 and BP down to 97/70, respirations 25 with O2 sat 93% on room air.  Labs significant for WBC of 15,000 troponin 53, potassium 3.3.  Influenza A positive.EKG, personally viewed and interpreted showing sinus tachycardia at 109 with no acute ischemic ST-T wave changes.  Chest x-ray suggestive of pneumonia as further outlined below: IMPRESSION: 1. Right middle and possible right lower lobe airspace disease, suspicious for pneumonia. Followup PA and lateral chest X-ray is recommended in 3-4 weeks following trial of antibiotic therapy to ensure resolution and exclude underlying malignancy. 2. Cardiomegaly and chronic interstitial thickening. This could relate to pulmonary venous congestion in this nonsmoker. 3. Decreased sensitivity and specificity exam due to technique related factors, as described above.  Patient was started on Tamiflu along with ceftriaxone and Zithromax for concern of superadded bacterial infection.  12/27: Vital stable, preliminary blood cultures negative in 12 hours, troponin 53>.61, BNP 825, CBC with some improvement of leukocytosis to 13.3, procalcitonin negative.  Clinically appears volume overload.  Prior echocardiogram done in 2020 with EF of 40 to 45%. DC IV fluid-ordered echocardiogram.  12/28: Later in the day yesterday patient did had worsening shortness of breath and hypoxia requiring up to 4 L of oxygen.  She was given 1 dose of IV Lasix. Vital stable this morning, saturating 100% on 4 L of  oxygen.  Labs stable with resolution of leukocytosis. Echocardiogram with EF of 30 to 35%, dilated cardiomyopathy with global hypokinesis. Patient apparently stopped taking all of her home medications which includes carvedilol and Lasix Patient was unable to lay flat due to shortness of breath when seen today.  Chest with bilateral crackles, she was given another dose of Lasix. Patient will need more diuresis and to restart home medications due to worsening EF. Cardiology was also consulted.  12/29: Vitals and labs stable.  Cardiology started her on low-dose digoxin, losartan and metoprolol.  Patient need ischemic workup with right and left cardiac catheterization which was never done before.  Blood pressure later becomes little soft so morning dose of losartan and metoprolol was held.  Remained on 3 to 4 L of oxygen with no baseline oxygen use.  12/30: Hemodynamically stable.  Still needing 2 L of oxygen.  Patient will be ambulated with pulse ox to determine the home oxygen requirement today.  Still no urinary output recorded, weight decreased to 276 from 206 which was recorded on 05/23/2022. Orthopnea and dyspnea seems improving.  BMP stable.  Will continue with IV diuresis for another day.

## 2022-05-24 NOTE — Assessment & Plan Note (Addendum)
Suspect secondary to demand ischemia from respiratory distress Patient reports mild left-sided chest pressure with EKG unremarkable.  She does have risk factors Continue to trend troponin Echocardiogram

## 2022-05-24 NOTE — ED Notes (Signed)
Attending states has reviewed VBG.

## 2022-05-24 NOTE — Assessment & Plan Note (Signed)
See above

## 2022-05-24 NOTE — Assessment & Plan Note (Signed)
Patient with EF of 40 to 45% on echo done in 2020.  Significant lower extremity edema.  Did receive IV fluid due to sepsis.  Elevated BNP -DC IV fluid -Repeat echocardiogram -Monitor volume status closely, if developed worsening shortness of breath, we will start her on diuresis.

## 2022-05-24 NOTE — ED Notes (Signed)
Pt given hat for toilet and educated how to use it; pt agrees to provide urine sample next time she is able.

## 2022-05-24 NOTE — H&P (Addendum)
History and Physical    Patient: Pamela Kramer OFB:[id number] DOB: [birth year] DOA: 05/24/2022 DOS: the patient was seen and examined on 05/24/2022 PCP: Maudie Flakes, FNP  Patient coming from: Home  Chief Complaint:  Chief Complaint  Patient presents with   Shortness of Breath    HPI: Pamela Kramer is a 66 y.o. female with medical history significant for HTN, diabetes, class III obesity who presents to the ED with a several day history of flulike symptoms of cough, shortness of breath and fever.  She has become increasingly dyspneic with inability to walk from the bed to the doorway without becoming extremely short of breath.  Her husband is currently hospitalized in ICU with influenza. ED course and data review: Tmax 99.8 with pulse 113 and BP down to 97/70, respirations 25 with O2 sat 93% on room air.  Labs significant for WBC of 15,000 troponin 53, potassium 3.3.  Influenza A positive.EKG, personally viewed and interpreted showing sinus tachycardia at 109 with no acute ischemic ST-T wave changes.  Chest x-ray suggestive of pneumonia as further outlined below: IMPRESSION: 1. Right middle and possible right lower lobe airspace disease, suspicious for pneumonia. Followup PA and lateral chest X-ray is recommended in 3-4 weeks following trial of antibiotic therapy to ensure resolution and exclude underlying malignancy. 2. Cardiomegaly and chronic interstitial thickening. This could relate to pulmonary venous congestion in this nonsmoker. 3. Decreased sensitivity and specificity exam due to technique related factors, as described above.  Patient treated with Rocephin and azithromycin and hospitalist consulted for admission.   Review of Systems: As mentioned in the history of present illness. All other systems reviewed and are negative.  Past Medical History:  Diagnosis Date   CHF (congestive heart failure) (HCC)    Hyperlipidemia    Hypertension    Pre-diabetes    Past  Surgical History:  Procedure Laterality Date   TUBAL LIGATION     Social History:  reports that she has never smoked. She has never used smokeless tobacco. She reports current alcohol use of about 1.0 standard drink of alcohol per week. She reports that she does not use drugs.  Allergies  Allergen Reactions   Excedrin Migraine  [Aspirin-Acetaminophen-Caffeine] Anaphylaxis   Lisinopril Cough   Entresto [Sacubitril-Valsartan] Nausea Only, Other (See Comments) and Cough    insomnia    History reviewed. No pertinent family history.  Prior to Admission medications   Medication Sig Start Date End Date Taking? Authorizing Provider  Ascorbic Acid (VITAMIN C WITH ROSE HIPS) 1000 MG tablet Take 1,000 mg by mouth 3 (three) times daily. Patient not taking: Reported on 04/06/2021    [provider]  atorvastatin (LIPITOR) 20 MG tablet Take 20 mg by mouth daily. Patient not taking: No sig reported    [provider]  carvedilol (COREG) 6.25 MG tablet Take 1 tablet (6.25 mg total) by mouth 2 (two) times daily. Patient not taking: No sig reported 08/25/19   Clarisa Kindred A, FNP  Cholecalciferol (VITAMIN D3) 10 MCG (400 UNIT) tablet Take 2,000 Units by mouth daily.    [provider]  furosemide (LASIX) 40 MG tablet Take 1 tablet (40 mg total) by mouth 2 (two) times daily. Patient not taking: No sig reported 09/25/19   Clarisa Kindred A, FNP  ipratropium-albuterol (DUONEB) 0.5-2.5 (3) MG/3ML SOLN Take 3 mLs by nebulization 3 (three) times daily.  Patient not taking: No sig reported    [provider]  metFORMIN (GLUCOPHAGE-XR) 500 MG 24 hr tablet Take  500 mg by mouth 2 times daily at 12 noon and 4 pm. Patient not taking: No sig reported    [provider]  omeprazole (PRILOSEC) 20 MG capsule Take 20 mg by mouth as needed (acid reflux). Patient not taking: Reported on 04/06/2021    [provider]  potassium chloride (KLOR-CON) 10 MEQ tablet Take 1  tablet (10 mEq total) by mouth daily. Patient not taking: Reported on 04/06/2021 09/25/19   Delma Freeze, FNP  topiramate (TOPAMAX) 50 MG tablet Take 50 mg by mouth at bedtime.  Patient not taking: No sig reported    [provider]  zinc gluconate 50 MG tablet Take 50 mg by mouth daily. Patient not taking: Reported on 04/06/2021    [provider]    Physical Exam: Vitals:   05/23/22 1727 05/23/22 1728 05/23/22 1730 05/24/22 0228  BP:   112/83 97/70  Pulse: (!) 113   (!) 101  Resp: 20   (!) 25  Temp: 99.8 F (37.7 C)   98 F (36.7 C)  TempSrc: Oral   Oral  SpO2: 92%   93%  Weight:  (!) 138.8 kg    Height:  5\' 6"  (1.676 m)     Physical Exam Vitals and nursing note reviewed.  Constitutional:      General: She is not in acute distress.    Appearance: She is morbidly obese. She is ill-appearing.     Comments: Patient very tachypneic, speaking in short sentences  HENT:     Head: Normocephalic and atraumatic.  Cardiovascular:     Rate and Rhythm: Regular rhythm. Tachycardia present.     Heart sounds: Normal heart sounds.  Pulmonary:     Effort: Tachypnea present.     Breath sounds: Wheezing present.  Abdominal:     Palpations: Abdomen is soft.     Tenderness: There is no abdominal tenderness.  Neurological:     Mental Status: Mental status is at baseline.     Labs on Admission: I have personally reviewed following labs and imaging studies  CBC: Recent Labs  Lab 05/23/22 1731  WBC 15.0*  NEUTROABS 13.4*  HGB 14.7  HCT 45.1  MCV 85.7  PLT 229   Basic Metabolic Panel: Recent Labs  Lab 05/23/22 1731  NA 137  K 3.3*  CL 102  CO2 24  GLUCOSE 132*  BUN 8  CREATININE 0.68  CALCIUM 8.9   GFR: Estimated Creatinine Clearance: 99.5 mL/min (by C-G formula based on SCr of 0.68 mg/dL). Liver Function Tests: No results for input(s): "AST", "ALT", "ALKPHOS", "BILITOT", "PROT", "ALBUMIN" in the last 168 hours. No results for input(s): "LIPASE",  "AMYLASE" in the last 168 hours. No results for input(s): "AMMONIA" in the last 168 hours. Coagulation Profile: No results for input(s): "INR", "PROTIME" in the last 168 hours. Cardiac Enzymes: No results for input(s): "CKTOTAL", "CKMB", "CKMBINDEX", "TROPONINI" in the last 168 hours. BNP (last 3 results) No results for input(s): "PROBNP" in the last 8760 hours. HbA1C: No results for input(s): "HGBA1C" in the last 72 hours. CBG: No results for input(s): "GLUCAP" in the last 168 hours. Lipid Profile: No results for input(s): "CHOL", "HDL", "LDLCALC", "TRIG", "CHOLHDL", "LDLDIRECT" in the last 72 hours. Thyroid Function Tests: No results for input(s): "TSH", "T4TOTAL", "FREET4", "T3FREE", "THYROIDAB" in the last 72 hours. Anemia Panel: No results for input(s): "VITAMINB12", "FOLATE", "FERRITIN", "TIBC", "IRON", "RETICCTPCT" in the last 72 hours. Urine analysis: No results found for: "COLORURINE", "APPEARANCEUR", "LABSPEC", "PHURINE", "GLUCOSEU", "HGBUR", "BILIRUBINUR", "  KETONESUR", "PROTEINUR", "UROBILINOGEN", "NITRITE", "LEUKOCYTESUR"  Radiological Exams on Admission: DG Chest 2 View  Result Date: 05/23/2022 CLINICAL DATA:  Cough.  Shortness of breath.  Fever. EXAM: CHEST - 2 VIEW COMPARISON:  03/15/2019 FINDINGS: Both images are limited by patient body habitus and arm position on the lateral view. Midline trachea. Moderate cardiomegaly. No pleural effusion or pneumothorax. Diffuse interstitial thickening is similar. Right middle and possible right lower lobe airspace disease. IMPRESSION: 1. Right middle and possible right lower lobe airspace disease, suspicious for pneumonia. Followup PA and lateral chest X-ray is recommended in 3-4 weeks following trial of antibiotic therapy to ensure resolution and exclude underlying malignancy. 2. Cardiomegaly and chronic interstitial thickening. This could relate to pulmonary venous congestion in this nonsmoker. 3. Decreased sensitivity and specificity  exam due to technique related factors, as described above. Electronically Signed   By: Jeronimo Greaves M.D.   On: 05/23/2022 17:57     Data Reviewed: Relevant notes from primary care and specialist visits, past discharge summaries as available in EHR, including Care Everywhere. Prior diagnostic testing as pertinent to current admission diagnoses Updated medications and problem lists for reconciliation ED course, including vitals, labs, imaging, treatment and response to treatment Triage notes, nursing and pharmacy notes and ED provider's notes Notable results as noted in HPI   Assessment and Plan: * Influenza A with pneumonia Sepsis Acute dyspnea/impending acute respiratory failure Sepsis criteria include fever, tachycardia, tachypnea, hypotension leukocytosis and pneumonia Patient very tachypneic speaking in 1 and 2 word sentences, very short of breath but O2 sat 92-93 on room air Tamiflu Rocephin and azithromycin for possible bacterial superinfection Sepsis fluids Follow cultures Albuterol as needed, antitussives and supportive care Oxygen if O2 sats drop below 92  Elevated troponin Suspect secondary to demand ischemia from respiratory distress Patient reports mild left-sided chest pressure with EKG unremarkable.  She does have risk factors Continue to trend troponin Can get echo cardiogram to evaluate for wall motion abnormality if uptrending Will give a dose of aspirin.  Hypotension Secondary to acute infection with sepsis Hold home antihypertensives IV hydration  Hypertension associated with diabetes (HCC) Hold home antihypertensives due to hypotension 97/70  Type 2 diabetes mellitus with other specified complication (HCC) Sliding scale insulin  Obesity, Class III, BMI 40-49.9 (morbid obesity) (HCC) Complicating factor to overall prognosis and care        DVT prophylaxis: Lovenox  Consults: none  Advance Care Planning: full code  Family Communication: Son  at bedside  Disposition Plan: Back to previous home environment  Severity of Illness: The appropriate patient status for this patient is INPATIENT. Inpatient status is judged to be reasonable and necessary in order to provide the required intensity of service to ensure the patient's safety. The patient's presenting symptoms, physical exam findings, and initial radiographic and laboratory data in the context of their chronic comorbidities is felt to place them at high risk for further clinical deterioration. Furthermore, it is not anticipated that the patient will be medically stable for discharge from the hospital within 2 midnights of admission.   * I certify that at the point of admission it is my clinical judgment that the patient will require inpatient hospital care spanning beyond 2 midnights from the point of admission due to high intensity of service, high risk for further deterioration and high frequency of surveillance required.*  Author: Andris Baumann, MD 05/24/2022 3:18 AM  For on call review [email or web address].

## 2022-05-24 NOTE — ED Notes (Signed)
Report given to Georgie, RN.

## 2022-05-24 NOTE — Progress Notes (Signed)
PHARMACIST - PHYSICIAN COMMUNICATION  CONCERNING:  Enoxaparin (Lovenox) for DVT Prophylaxis    RECOMMENDATION: Patient was prescribed enoxaprin 40mg  q24 hours for VTE prophylaxis.   Filed Weights   05/23/22 1728  Weight: (!) 138.8 kg (306 lb)    Body mass index is 49.39 kg/m.  Estimated Creatinine Clearance: 99.5 mL/min (by C-G formula based on SCr of 0.68 mg/dL).   Based on [facility] policy patient is candidate for enoxaparin 0.5mg /kg TBW SQ every 24 hours based on BMI being >30.  DESCRIPTION: Pharmacy has adjusted enoxaparin dose per [facility] policy.  Patient is now receiving enoxaparin 0.5 mg/kg every 24 hours   [facility], PharmD, [facility] 05/24/2022 3:36 AM

## 2022-05-24 NOTE — ED Notes (Signed)
Dietary dropped off pt's dinner tray with drink.

## 2022-05-24 NOTE — Progress Notes (Signed)
Progress Note   Patient: Pamela Kramer HQI:[id number] DOB: Oct 13, 1955 DOA: 05/24/2022     0 DOS: the patient was seen and examined on 05/24/2022   Brief hospital course: Taken from H&P.  Pamela Kramer is a 66 y.o. female with medical history significant for HTN, HFrEF,diabetes, class III obesity who presents to the ED with a several day history of flulike symptoms of cough, shortness of breath and fever.  She has become increasingly dyspneic with inability to walk from the bed to the doorway without becoming extremely short of breath.  Her husband is currently hospitalized in ICU with influenza. ED course and data review: Tmax 99.8 with pulse 113 and BP down to 97/70, respirations 25 with O2 sat 93% on room air.  Labs significant for WBC of 15,000 troponin 53, potassium 3.3.  Influenza A positive.EKG, personally viewed and interpreted showing sinus tachycardia at 109 with no acute ischemic ST-T wave changes.  Chest x-ray suggestive of pneumonia as further outlined below: IMPRESSION: 1. Right middle and possible right lower lobe airspace disease, suspicious for pneumonia. Followup PA and lateral chest X-ray is recommended in 3-4 weeks following trial of antibiotic therapy to ensure resolution and exclude underlying malignancy. 2. Cardiomegaly and chronic interstitial thickening. This could relate to pulmonary venous congestion in this nonsmoker. 3. Decreased sensitivity and specificity exam due to technique related factors, as described above.  Patient was started on Tamiflu along with ceftriaxone and Zithromax for concern of superadded bacterial infection.  12/27: Vital stable, preliminary blood cultures negative in 12 hours, troponin 53>.61, BNP 825, CBC with some improvement of leukocytosis to 13.3, procalcitonin negative.  Clinically appears volume overload.  Prior echocardiogram done in 2020 with EF of 40 to 45%. DC IV fluid-ordered echocardiogram.  Assessment and Plan: * Influenza A  with pneumonia Sepsis Acute dyspnea/impending acute respiratory failure Sepsis criteria include fever, tachycardia, tachypnea, hypotension leukocytosis and pneumonia.  Procalcitonin negative.  Preliminary blood cultures negative Tamiflu Discontinue Rocephin Continue azithromycin  Albuterol as needed, antitussives and supportive care Oxygen if O2 sats drop below 92  Sepsis (HCC) -See above  Elevated troponin Suspect secondary to demand ischemia from respiratory distress Patient reports mild left-sided chest pressure with EKG unremarkable.  She does have risk factors Continue to trend troponin Echocardiogram  Hypotension Pressure within goal now.  Initially softer blood pressure most likely secondary to sepsis. -Keep holding home antihypertensives-we will restart as needed  Chronic HFrEF (heart failure with reduced ejection fraction) (HCC) Patient with EF of 40 to 45% on echo done in 2020.  Significant lower extremity edema.  Did receive IV fluid due to sepsis.  Elevated BNP -DC IV fluid -Repeat echocardiogram -Monitor volume status closely, if developed worsening shortness of breath, we will start her on diuresis.  Hypertension associated with diabetes (HCC) Hold home antihypertensives due to softer blood pressure on admission  Type 2 diabetes mellitus with other specified complication (HCC) -Sliding scale insulin  Obesity, Class III, BMI 40-49.9 (morbid obesity) (HCC) Estimated body mass index is 49.39 kg/m as calculated from the following:   Height as of this encounter: 5\' 6"  (1.676 m).   Weight as of this encounter: 138.8 kg.   Complicating factor to overall prognosis and care   Subjective: Patient was feeling little improved when seen today.  Still having shortness of breath although saturating well on room air.  Physical Exam: Vitals:   05/24/22 1322 05/24/22 1330 05/24/22 1354 05/24/22 1400  BP:  121/88    Pulse: (!) 106 (!) 105 05/26/22)  104 (!) 109  Resp: 20 (!) 24  (!) 23 18  Temp:      TempSrc:      SpO2: 97% 97% (!) 87% 99%  Weight:      Height:       General.  Morbidly obese lady, in no acute distress. Pulmonary.  Some scattered rhonchi bilaterally, normal respiratory effort. CV.  Regular rate and rhythm, no JVD, rub or murmur. Abdomen.  Soft, nontender, nondistended, BS positive. CNS.  Alert and oriented .  No focal neurologic deficit. Extremities.  2+ LE edema, no cyanosis, pulses intact and symmetrical. Psychiatry.  Judgment and insight appears normal.   Data Reviewed: Prior data reviewed  Family Communication: Talked with son on phone.  Disposition: Status is: Inpatient Remains inpatient appropriate because: Severity of illness  Planned Discharge Destination: Home  Time spent: 45 minutes  This record has been created using Systems analyst. Errors have been sought and corrected,but may not always be located. Such creation errors do not reflect on the standard of care.   Author: Lorella Nimrod, MD 05/24/2022 3:02 PM  For on call review [email or web address].

## 2022-05-24 NOTE — Assessment & Plan Note (Addendum)
Pressure within goal now.  Initially softer blood pressure most likely secondary to sepsis. -Keep holding home antihypertensives-we will restart as needed

## 2022-05-24 NOTE — ED Notes (Signed)
Pt unable to give urine sample at this time 

## 2022-05-24 NOTE — ED Notes (Signed)
Attending S. Amin notified via secure chat that this RN had to bump pt to 4L via [place] as was so SOB with inc WOB and anxiety.

## 2022-05-24 NOTE — Assessment & Plan Note (Addendum)
Hold home antihypertensives due to softer blood pressure on admission

## 2022-05-24 NOTE — ED Provider Notes (Signed)
[facility] Provider Note    Event Date/Time   First MD Initiated Contact with Patient 05/24/22 0245     (approximate)   History   Shortness of Breath   HPI  Pamela Kramer is a 66 y.o. female with history of morbid obesity, hypertension, hyperlipidemia, CHF who presents to the emergency department with her son for complaints of fevers, cough, congestion, shortness of breath for the past week.  States her husband is in the ICU.   History provided by patient and son.    Past Medical History:  Diagnosis Date   CHF (congestive heart failure) (HCC)    Hyperlipidemia    Hypertension    Pre-diabetes     Past Surgical History:  Procedure Laterality Date   TUBAL LIGATION      MEDICATIONS:  Prior to Admission medications   Medication Sig Start Date End Date Taking? Authorizing Provider  Ascorbic Acid (VITAMIN C WITH ROSE HIPS) 1000 MG tablet Take 1,000 mg by mouth 3 (three) times daily. Patient not taking: Reported on 04/06/2021    [provider]  atorvastatin (LIPITOR) 20 MG tablet Take 20 mg by mouth daily. Patient not taking: No sig reported    [provider]  carvedilol (COREG) 6.25 MG tablet Take 1 tablet (6.25 mg total) by mouth 2 (two) times daily. Patient not taking: No sig reported 08/25/19   Darylene Price A, FNP  Cholecalciferol (VITAMIN D3) 10 MCG (400 UNIT) tablet Take 2,000 Units by mouth daily.    [provider]  furosemide (LASIX) 40 MG tablet Take 1 tablet (40 mg total) by mouth 2 (two) times daily. Patient not taking: No sig reported 09/25/19   Darylene Price A, FNP  ipratropium-albuterol (DUONEB) 0.5-2.5 (3) MG/3ML SOLN Take 3 mLs by nebulization 3 (three) times daily.  Patient not taking: No sig reported    [provider]  metFORMIN (GLUCOPHAGE-XR) 500 MG 24 hr tablet Take 500 mg by mouth 2 times daily at 12 noon and 4 pm. Patient not taking: No sig reported    [provider]   omeprazole (PRILOSEC) 20 MG capsule Take 20 mg by mouth as needed (acid reflux). Patient not taking: Reported on 04/06/2021    [provider]  potassium chloride (KLOR-CON) 10 MEQ tablet Take 1 tablet (10 mEq total) by mouth daily. Patient not taking: Reported on 04/06/2021 09/25/19   Alisa Graff, FNP  topiramate (TOPAMAX) 50 MG tablet Take 50 mg by mouth at bedtime.  Patient not taking: No sig reported    [provider]  zinc gluconate 50 MG tablet Take 50 mg by mouth daily. Patient not taking: Reported on 04/06/2021    [provider]    Physical Exam   Triage Vital Signs: ED Triage Vitals  Enc Vitals Group     BP 05/23/22 1730 112/83     Pulse Rate 05/23/22 1727 (!) 113     Resp 05/23/22 1727 20     Temp 05/23/22 1727 99.8 F (37.7 C)     Temp Source 05/23/22 1727 Oral     SpO2 05/23/22 1727 92 %     Weight 05/23/22 1728 (!) 306 lb (138.8 kg)     Height 05/23/22 1728 5\' 6"  (1.676 m)     Head Circumference --      Peak Flow --      Pain Score 05/23/22 1728 0     Pain Loc --      Pain Edu? --  Excl. in GC? --     Most recent vital signs: Vitals:   05/23/22 1730 05/24/22 0228  BP: 112/83 97/70  Pulse:  (!) 101  Resp:  (!) 25  Temp:  98 F (36.7 C)  SpO2:  93%    CONSTITUTIONAL: Alert and oriented and responds appropriately to questions.  Chronically ill-appearing, morbidly obese HEAD: Normocephalic, atraumatic EYES: Conjunctivae clear, pupils appear equal, sclera nonicteric ENT: normal nose; moist mucous membranes NECK: Supple, normal ROM CARD: Regular and tachycardic; S1 and S2 appreciated; no murmurs, no clicks, no rubs, no gallops RESP: Diminished aeration at bases bilaterally.  No hypoxia at rest but patient does appear dyspneic with increased work of breathing with just minimal exertion in the bed.  Refuses to attempt ambulation due to extreme shortness of breath.  No hypoxia at rest. ABD/GI: Normal bowel sounds; non-distended;  soft, non-tender, no rebound, no guarding, no peritoneal signs BACK: The back appears normal EXT: Normal ROM in all joints; no deformity noted, no edema; no cyanosis SKIN: Normal color for age and race; warm; no rash on exposed skin NEURO: Moves all extremities equally, normal speech PSYCH: The patient's mood and manner are appropriate.   ED Results / Procedures / Treatments   LABS: (all labs ordered are listed, but only abnormal results are displayed) Labs Reviewed  RESP PANEL BY RT-PCR (RSV, FLU A&B, COVID)  RVPGX2 - Abnormal; Notable for the following components:      Result Value   Influenza A by PCR POSITIVE (*)    All other components within normal limits  BASIC METABOLIC PANEL - Abnormal; Notable for the following components:   Potassium 3.3 (*)    Glucose, Bld 132 (*)    All other components within normal limits  CBC WITH DIFFERENTIAL/PLATELET - Abnormal; Notable for the following components:   WBC 15.0 (*)    RBC 5.26 (*)    Neutro Abs 13.4 (*)    All other components within normal limits  BRAIN NATRIURETIC PEPTIDE - Abnormal; Notable for the following components:   B Natriuretic Peptide 825.3 (*)    All other components within normal limits  TROPONIN I (HIGH SENSITIVITY) - Abnormal; Notable for the following components:   Troponin I (High Sensitivity) 53 (*)    All other components within normal limits  TROPONIN I (HIGH SENSITIVITY) - Abnormal; Notable for the following components:   Troponin I (High Sensitivity) 61 (*)    All other components within normal limits  CULTURE, BLOOD (ROUTINE X 2)  CULTURE, BLOOD (ROUTINE X 2)  PROCALCITONIN  URINALYSIS, ROUTINE W REFLEX MICROSCOPIC  HEMOGLOBIN A1C  HIV ANTIBODY (ROUTINE TESTING W REFLEX)  PROTIME-INR  CORTISOL-AM, BLOOD  CBC  CREATININE, SERUM     EKG:  EKG Interpretation  Date/Time:  Tuesday May 23 2022 17:30:58 EST Ventricular Rate:  109 PR Interval:  152 QRS Duration: 84 QT Interval:  366 QTC  Calculation: 492 R Axis:   90 Text Interpretation: Sinus tachycardia Rightward axis Low voltage QRS Borderline ECG When compared with ECG of 11-Mar-2019 09:03, PREVIOUS ECG IS PRESENT Confirmed by Rochele Raring [phone]) on 05/24/2022 3:38:55 AM         RADIOLOGY: My personal review and interpretation of imaging: Chest x-ray shows right middle and lower lobe pneumonia.  I have personally reviewed all radiology reports.   DG Chest 2 View  Result Date: 05/23/2022 CLINICAL DATA:  Cough.  Shortness of breath.  Fever. EXAM: CHEST - 2 VIEW COMPARISON:  03/15/2019 FINDINGS: Both images  are limited by patient body habitus and arm position on the lateral view. Midline trachea. Moderate cardiomegaly. No pleural effusion or pneumothorax. Diffuse interstitial thickening is similar. Right middle and possible right lower lobe airspace disease. IMPRESSION: 1. Right middle and possible right lower lobe airspace disease, suspicious for pneumonia. Followup PA and lateral chest X-ray is recommended in 3-4 weeks following trial of antibiotic therapy to ensure resolution and exclude underlying malignancy. 2. Cardiomegaly and chronic interstitial thickening. This could relate to pulmonary venous congestion in this nonsmoker. 3. Decreased sensitivity and specificity exam due to technique related factors, as described above. Electronically Signed   By: Abigail Miyamoto M.D.   On: 05/23/2022 17:57     PROCEDURES:  Critical Care performed: No     .1-3 Lead EKG Interpretation  Performed by: Tavon Magnussen, Delice Bison, DO Authorized by: Shequita Peplinski, Delice Bison, DO     Interpretation: normal     ECG rate:  101   ECG rate assessment: tachycardic     Rhythm: sinus tachycardia     Ectopy: none     Conduction: normal       IMPRESSION / MDM / ASSESSMENT AND PLAN / ED COURSE  I reviewed the triage vital signs and the nursing notes.    Patient here with shortness of breath, fever, cough and congestion.  The patient is on the  cardiac monitor to evaluate for evidence of arrhythmia and/or significant heart rate changes.   DIFFERENTIAL DIAGNOSIS (includes but not limited to):   Viral URI, pneumonia, CHF, PE, ACS   Patient's presentation is most consistent with acute presentation with potential threat to life or bodily function.   PLAN: Workup initiated from triage.  Patient does have a leukocytosis of 15,000 with left shift.  Troponin elevated at 53.  Second pending.  She is flu positive.  Chest x-ray reviewed and interpreted by myself and the radiologist and shows right middle and lower lobe pneumonia.  Likely viral in nature but will give ceftriaxone and azithromycin to cover for bacterial cause.  Not hypoxic here but is tachycardic with leukocytosis so therefore meets sepsis criteria.  Will obtain blood cultures.  Will obtain procalcitonin, BNP and repeat troponin.   MEDICATIONS GIVEN IN ED: Medications  azithromycin (ZITHROMAX) 500 mg in sodium chloride 0.9 % 250 mL IVPB (500 mg Intravenous New Bag/Given 05/24/22 0324)  aspirin chewable tablet 324 mg (has no administration in time range)  oseltamivir (TAMIFLU) capsule 75 mg (has no administration in time range)  insulin aspart (novoLOG) injection 0-20 Units (has no administration in time range)  insulin aspart (novoLOG) injection 0-5 Units (has no administration in time range)  enoxaparin (LOVENOX) injection 70 mg (has no administration in time range)  lactated ringers bolus 1,000 mL (has no administration in time range)  lactated ringers infusion (has no administration in time range)  cefTRIAXone (ROCEPHIN) 2 g in sodium chloride 0.9 % 100 mL IVPB (has no administration in time range)  azithromycin (ZITHROMAX) 500 mg in sodium chloride 0.9 % 250 mL IVPB (has no administration in time range)  acetaminophen (TYLENOL) tablet 650 mg (has no administration in time range)    Or  acetaminophen (TYLENOL) suppository 650 mg (has no administration in time range)   HYDROcodone-acetaminophen (NORCO/VICODIN) 5-325 MG per tablet 1-2 tablet (has no administration in time range)  ondansetron (ZOFRAN) tablet 4 mg (has no administration in time range)    Or  ondansetron (ZOFRAN) injection 4 mg (has no administration in time range)  albuterol (PROVENTIL) (2.5  MG/3ML) 0.083% nebulizer solution 2.5 mg (has no administration in time range)  guaiFENesin (MUCINEX) 12 hr tablet 600 mg (has no administration in time range)  guaiFENesin-dextromethorphan (ROBITUSSIN DM) 100-10 MG/5ML syrup 5 mL (has no administration in time range)  cefTRIAXone (ROCEPHIN) 2 g in sodium chloride 0.9 % 100 mL IVPB (2 g Intravenous New Bag/Given 05/24/22 0324)     ED COURSE: Procalcitonin negative.  Troponin rising minimally.  BNP over 800.  But no peripheral edema on exam and chest x-ray looks more infectious than due to edema.  Will continue to monitor.  Will discuss with medicine for admission.   CONSULTS:  Consulted and discussed patient's case with hospitalist, Dr. Damita Dunnings.  I have recommended admission and consulting physician agrees and will place admission orders.  Patient (and family if present) agree with this plan.   I reviewed all nursing notes, vitals, pertinent previous records.  All labs, EKGs, imaging ordered have been independently reviewed and interpreted by myself.    OUTSIDE RECORDS REVIEWED: Reviewed patient's last family medicine note on 03/26/2020.       FINAL CLINICAL IMPRESSION(S) / ED DIAGNOSES   Final diagnoses:  Influenza A  SIRS (systemic inflammatory response syndrome) (HCC)  Pneumonia of right middle lobe due to infectious organism  Pneumonia of right lower lobe due to infectious organism     Rx / DC Orders   ED Discharge Orders     None        Note:  This document was prepared using Dragon voice recognition software and may include unintentional dictation errors.   Khaliya Golinski, Delice Bison, DO 05/24/22 [phone]

## 2022-05-24 NOTE — ED Notes (Signed)
Pt states coming in for shortness of breath. Pt states being diagnosed with flu and pneumonia. Pt on cardiac, and pulse ox monitor

## 2022-05-24 NOTE — Progress Notes (Signed)
*  PRELIMINARY RESULTS* Echocardiogram 2D Echocardiogram has been performed.  Pamela Kramer 05/24/2022, 1:56 PM

## 2022-05-24 NOTE — ED Notes (Signed)
Pt set off call bell as was sitting in chair next to bed with inc WOB; pt stated she was back from the restroom; pt repositioned in bed and placed on 2L O2 via [place] as 90% RA; 96% on 2L; WOB under control after about 2 minutes of sitting in bed. Pt has wet cough.

## 2022-05-24 NOTE — ED Notes (Signed)
BG 128

## 2022-05-24 NOTE — Assessment & Plan Note (Addendum)
Sepsis Acute dyspnea/impending acute respiratory failure Sepsis criteria include fever, tachycardia, tachypnea, hypotension leukocytosis and pneumonia.  Procalcitonin negative.  Preliminary blood cultures negative Tamiflu Discontinue Rocephin Continue azithromycin  Albuterol as needed, antitussives and supportive care Oxygen if O2 sats drop below 92

## 2022-05-24 NOTE — ED Notes (Signed)
Per Annette Stable RN pt had pulled off her [place] and alarm went off since she desatted. Bill RN educated pt and helped her put [place] back on.

## 2022-05-24 NOTE — Assessment & Plan Note (Addendum)
Sliding-scale insulin 

## 2022-05-24 NOTE — ED Notes (Signed)
Pt's WOB decreased some and pt is less anxious. Will keep pt on 4L O2 and try to ween later if possible.

## 2022-05-24 NOTE — ED Notes (Signed)
Pt's [place] fixed as she had accidentally pulled it out to her L cheek.

## 2022-05-24 NOTE — ED Notes (Signed)
This RN to bedside to assess pt. Pt at her baseline currently; pt states she did not mean to remove [place] or had forgotten she took it off. Pt's cardiac monitor adjusted. Pt remains on 4L O2.

## 2022-05-25 DIAGNOSIS — E1159 Type 2 diabetes mellitus with other circulatory complications: Secondary | ICD-10-CM

## 2022-05-25 DIAGNOSIS — I959 Hypotension, unspecified: Secondary | ICD-10-CM

## 2022-05-25 DIAGNOSIS — E1169 Type 2 diabetes mellitus with other specified complication: Secondary | ICD-10-CM

## 2022-05-25 DIAGNOSIS — I152 Hypertension secondary to endocrine disorders: Secondary | ICD-10-CM

## 2022-05-25 DIAGNOSIS — I5023 Acute on chronic systolic (congestive) heart failure: Secondary | ICD-10-CM

## 2022-05-25 LAB — CBC
HCT: 42.2 % (ref 36.0–46.0)
Hemoglobin: 12.9 g/dL (ref 12.0–15.0)
MCH: 27.5 pg (ref 26.0–34.0)
MCHC: 30.6 g/dL (ref 30.0–36.0)
MCV: 90 fL (ref 80.0–100.0)
Platelets: 235 10*3/uL (ref 150–400)
RBC: 4.69 MIL/uL (ref 3.87–5.11)
RDW: 13.9 % (ref 11.5–15.5)
WBC: 9.4 10*3/uL (ref 4.0–10.5)
nRBC: 0 % (ref 0.0–0.2)

## 2022-05-25 LAB — GLUCOSE, CAPILLARY
Glucose-Capillary: 118 mg/dL — ABNORMAL HIGH (ref 70–99)
Glucose-Capillary: 123 mg/dL — ABNORMAL HIGH (ref 70–99)

## 2022-05-25 LAB — BASIC METABOLIC PANEL
Anion gap: 7 (ref 5–15)
BUN: 12 mg/dL (ref 8–23)
CO2: 31 mmol/L (ref 22–32)
Calcium: 8.6 mg/dL — ABNORMAL LOW (ref 8.9–10.3)
Chloride: 100 mmol/L (ref 98–111)
Creatinine, Ser: 0.76 mg/dL (ref 0.44–1.00)
GFR, Estimated: >60 mL/min (ref >60)
Glucose, Bld: 131 mg/dL — ABNORMAL HIGH (ref 70–99)
Potassium: 3.5 mmol/L (ref 3.5–5.1)
Sodium: 138 mmol/L (ref 135–145)

## 2022-05-25 LAB — CBG MONITORING, ED
Glucose-Capillary: 98 mg/dL (ref 70–99)
Glucose-Capillary: 109 mg/dL — ABNORMAL HIGH (ref 70–99)

## 2022-05-25 LAB — ECHOCARDIOGRAM COMPLETE: S' Lateral: 5.3 cm

## 2022-05-25 LAB — HEMOGLOBIN A1C
Hgb A1c MFr Bld: 6.1 % — ABNORMAL HIGH (ref 4.8–5.6)
Mean Plasma Glucose: 128 mg/dL

## 2022-05-25 MED ORDER — METOPROLOL SUCCINATE ER 25 MG PO TB24
25.0000 mg | ORAL_TABLET | Freq: Every day | ORAL | Status: DC
  Administered 2022-05-25 – 2022-05-28 (×3): 25 mg via ORAL
  Filled 2022-05-25 (×4): qty 1

## 2022-05-25 MED ORDER — FUROSEMIDE 10 MG/ML IJ SOLN
40.0000 mg | Freq: Every day | INTRAMUSCULAR | Status: DC
  Administered 2022-05-26 – 2022-05-27 (×2): 40 mg via INTRAVENOUS
  Filled 2022-05-25 (×3): qty 4

## 2022-05-25 MED ORDER — LOSARTAN POTASSIUM 25 MG PO TABS
12.5000 mg | ORAL_TABLET | Freq: Every day | ORAL | Status: DC
  Administered 2022-05-27 – 2022-05-28 (×2): 12.5 mg via ORAL
  Filled 2022-05-25 (×3): qty 1

## 2022-05-25 MED ORDER — DIGOXIN 125 MCG PO TABS
0.1250 mg | ORAL_TABLET | Freq: Every day | ORAL | Status: DC
  Administered 2022-05-26 – 2022-05-28 (×3): 0.125 mg via ORAL
  Filled 2022-05-25 (×3): qty 1

## 2022-05-25 MED ORDER — FUROSEMIDE 10 MG/ML IJ SOLN
40.0000 mg | Freq: Once | INTRAMUSCULAR | Status: AC
  Administered 2022-05-25: 40 mg via INTRAVENOUS
  Filled 2022-05-25: qty 4

## 2022-05-25 MED ORDER — AZITHROMYCIN 500 MG PO TABS
500.0000 mg | ORAL_TABLET | Freq: Every day | ORAL | Status: AC
  Administered 2022-05-25 – 2022-05-27 (×3): 500 mg via ORAL
  Filled 2022-05-25 (×3): qty 1

## 2022-05-25 MED ORDER — DIGOXIN 250 MCG PO TABS
0.2500 mg | ORAL_TABLET | Freq: Once | ORAL | Status: AC
  Administered 2022-05-25: 0.25 mg via ORAL
  Filled 2022-05-25 (×2): qty 1

## 2022-05-25 NOTE — Progress Notes (Signed)
PHARMACIST - PHYSICIAN COMMUNICATION  CONCERNING: Antibiotic IV to Oral Route Change Policy  RECOMMENDATION: This patient is receiving azithromycin by the intravenous route.  Based on criteria approved by the Pharmacy and Therapeutics Committee, the antibiotic(s) is/are being converted to the equivalent oral dose form(s).   DESCRIPTION: These criteria include: Patient being treated for a respiratory tract infection, urinary tract infection, cellulitis or clostridium difficile associated diarrhea if on metronidazole The patient is not neutropenic and does not exhibit a GI malabsorption state The patient is eating (either orally or via tube) and/or has been taking other orally administered medications for a least 24 hours The patient is improving clinically and has a Tmax < 100.5  If you have questions about this conversion, please contact the Pharmacy Department   Tressie Ellis 05/25/22

## 2022-05-25 NOTE — Plan of Care (Signed)
  Problem: Activity: Goal: Risk for activity intolerance will decrease Outcome: Progressing   Problem: Nutrition: Goal: Adequate nutrition will be maintained Outcome: Progressing   Problem: Coping: Goal: Level of anxiety will decrease Outcome: Progressing   Problem: Safety: Goal: Ability to remain free from injury will improve Outcome: Progressing   

## 2022-05-25 NOTE — Assessment & Plan Note (Addendum)
Patient with EF of 40 to 45% on echo done in 2020.  Significant lower extremity edema.  Did receive IV fluid due to sepsis.  Elevated BNP Repeat echocardiogram with worsening of EF to 30 to 35%, dilated cardiomyopathy and global hypokinesis.  She did received a dose of Lasix.  Endorses orthopnea this morning.  Patient has stopped taking her home medications for about a year. -Start her on daily IV Lasix 40 mg-will continue for another day -Cardiology added low-dose digoxin, losartan and metoprolol -Monitor volume status closely -Daily weight and BMP -Strict intake and output -Patient will need ischemic workup with right and left cardiac catheterization which can be done as outpatient per cardiology

## 2022-05-25 NOTE — Consult Note (Signed)
Cardiology Consultation   Patient ID: Pamela Kramer MRN: [id number]; DOB: April 10, 1956  Admit date: 05/24/2022 Date of Consult: 05/25/2022  PCP:  Oneita HurtPcp, No   [facility] Providers Cardiologist:  None   { The patient was seen in 2020 by Dr. Juliann Paresallwood but she requested a different provider.   Patient Profile:   Pamela Kramer is a 66 y.o. female with a hx of chronic systolic heart failure, type 2 diabetes and obesity who is being seen 05/25/2022 for the evaluation of heart failure at the request of Dr. Nelson ChimesAmin.  History of Present Illness:   Pamela Kramer is a 66 year old female with known history of chronic systolic heart failure, type 2 diabetes, obesity and essential hypertension.  She is not a smoker.  She was seen by Dr. Juliann Paresallwood in 2020 for newly diagnosed heart failure with an EF of 35 to 40%.  A nuclear stress test was done which showed no clear perfusion defects.  She never had any cardiac catheterization done.  She was frustrated due to her symptoms and side effects with medications and wanted to follow a holistic approach.  Thus, she stopped going to her cardiology appointment.  [facility] but ultimately stopped as well. She presented with flulike symptoms with increased shortness of breath.  She did notice that she has been tachycardic lately.  She reports orthopnea and some lower extremity edema.  No chest pain.  She is being treated for the flu.  She underwent an echocardiogram which showed an EF of 30 to 35% with global hypokinesis.   Past Medical History:  Diagnosis Date   CHF (congestive heart failure) (HCC)    Hyperlipidemia    Hypertension    Pre-diabetes     Past Surgical History:  Procedure Laterality Date   TUBAL LIGATION       Home Medications:  Prior to Admission medications   Medication Sig Start Date End Date Taking? Authorizing Provider  Ascorbic Acid (VITAMIN C WITH ROSE HIPS)  1000 MG tablet Take 1,000 mg by mouth 3 (three) times daily. Patient not taking: Reported on 04/06/2021    [provider]  atorvastatin (LIPITOR) 20 MG tablet Take 20 mg by mouth daily. Patient not taking: No sig reported    [provider]  carvedilol (COREG) 6.25 MG tablet Take 1 tablet (6.25 mg total) by mouth 2 (two) times daily. Patient not taking: No sig reported 08/25/19   Clarisa KindredHackney, Tina A, FNP  Cholecalciferol (VITAMIN D3) 10 MCG (400 UNIT) tablet Take 2,000 Units by mouth daily.    [provider]  furosemide (LASIX) 40 MG tablet Take 1 tablet (40 mg total) by mouth 2 (two) times daily. Patient not taking: No sig reported 09/25/19   Clarisa KindredHackney, Tina A, FNP  ipratropium-albuterol (DUONEB) 0.5-2.5 (3) MG/3ML SOLN Take 3 mLs by nebulization 3 (three) times daily.  Patient not taking: No sig reported    [provider]  metFORMIN (GLUCOPHAGE-XR) 500 MG 24 hr tablet Take 500 mg by mouth 2 times daily at 12 noon and 4 pm. Patient not taking: No sig reported    [provider]  omeprazole (PRILOSEC) 20 MG capsule Take 20 mg by mouth as needed (acid reflux). Patient not taking: Reported on 04/06/2021    [provider]  potassium chloride (KLOR-CON) 10 MEQ tablet Take 1 tablet (10 mEq total) by mouth daily. Patient not taking: Reported on 04/06/2021 09/25/19   Bing NeighborsHackney,  Jarold Song, FNP  topiramate (TOPAMAX) 50 MG tablet Take 50 mg by mouth at bedtime.  Patient not taking: No sig reported    [provider]  zinc gluconate 50 MG tablet Take 50 mg by mouth daily. Patient not taking: Reported on 04/06/2021    [provider]    Inpatient Medications: Scheduled Meds:  albuterol  2.5 mg Nebulization Q6H   azithromycin  500 mg Oral QHS   [START ON 05/26/2022] digoxin  0.125 mg Oral Daily   digoxin  0.25 mg Oral Once   enoxaparin (LOVENOX) injection  0.5 mg/kg Subcutaneous Q24H   [START ON 05/26/2022] furosemide  40 mg Intravenous Daily    guaiFENesin  600 mg Oral BID   insulin aspart  0-20 Units Subcutaneous TID WC   insulin aspart  0-5 Units Subcutaneous QHS   [START ON 05/26/2022] losartan  12.5 mg Oral Daily   metoprolol succinate  25 mg Oral Daily   oseltamivir  75 mg Oral BID   Continuous Infusions:  PRN Meds: acetaminophen **OR** acetaminophen, guaiFENesin-dextromethorphan, HYDROcodone-acetaminophen, ondansetron **OR** ondansetron (ZOFRAN) IV  Allergies:    Allergies  Allergen Reactions   Excedrin Migraine  [Aspirin-Acetaminophen-Caffeine] Anaphylaxis   Lisinopril Cough   Entresto [Sacubitril-Valsartan] Nausea Only, Other (See Comments) and Cough    insomnia    Social History:   Social History   Socioeconomic History   Marital status: Married    Spouse name: Not on file   Number of children: Not on file   Years of education: Not on file   Highest education level: Not on file  Occupational History   Not on file  Tobacco Use   Smoking status: Never   Smokeless tobacco: Never  Substance and Sexual Activity   Alcohol use: Yes    Alcohol/week: 1.0 standard drink of alcohol    Types: 1 Glasses of wine per week   Drug use: No   Sexual activity: Not Currently  Other Topics Concern   Not on file  Social History Narrative   Not on file   Social Determinants of Health   Financial Resource Strain: Low Risk  (03/28/2019)   Overall Financial Resource Strain (CARDIA)    Difficulty of Paying Living Expenses: Not hard at all  Food Insecurity: No Food Insecurity (03/28/2019)   Hunger Vital Sign    Worried About Running Out of Food in the Last Year: Never true    Ran Out of Food in the Last Year: Never true  Transportation Needs: No Transportation Needs (03/28/2019)   PRAPARE - Administrator, Civil Service (Medical): No    Lack of Transportation (Non-Medical): No  Physical Activity: Unknown (03/28/2019)   Exercise Vital Sign    Days of Exercise per Week: 7 days    Minutes of Exercise per  Session: Not on file  Stress: No Stress Concern Present (03/28/2019)   [employer] of Occupational Health - Occupational Stress Questionnaire    Feeling of Stress : Not at all  Social Connections: Unknown (03/28/2019)   Social Connection and Isolation Panel [NHANES]    Frequency of Communication with Friends and Family: More than three times a week    Frequency of Social Gatherings with Friends and Family: More than three times a week    Attends Religious Services: Patient refused    Active Member of Clubs or Organizations: No    Attends Banker Meetings: Never    Marital Status: Married  Catering manager Violence: Not At Risk (  03/28/2019)   Humiliation, Afraid, Rape, and Kick questionnaire    Fear of Current or Ex-Partner: No    Emotionally Abused: No    Physically Abused: No    Sexually Abused: No    Family History:   History reviewed. No pertinent family history.   ROS:  Please see the history of present illness.   All other ROS reviewed and negative.     Physical Exam/Data:   Vitals:   05/25/22 0500 05/25/22 0700 05/25/22 0747 05/25/22 1209  BP: 109/77 (!) 115/91  120/88  Pulse: 89 93  (!) 106  Resp: 15 (!) 21  20  Temp:   98 F (36.7 C) 98 F (36.7 C)  TempSrc:   Oral Oral  SpO2: 99% 100%  100%  Weight:      Height:       No intake or output data in the 24 hours ending 05/25/22 1553    05/23/2022    5:28 PM 04/06/2021    3:22 PM 11/09/2020    8:50 AM  Last 3 Weights  Weight (lbs) 306 lb 306 lb 3 oz 314 lb 2 oz  Weight (kg) 138.801 kg 138.886 kg 142.486 kg     Body mass index is 49.39 kg/m.  General:  Well nourished, well developed, in no acute distress HEENT: normal Neck: Jugular venous pressure is not well-visualized. Vascular: No carotid bruits; Distal pulses 2+ bilaterally Cardiac:  normal S1, S2; tachycardic; no murmur  Lungs:  clear to auscultation bilaterally, no wheezing, rhonchi or rales  Abd: soft, nontender, no hepatomegaly   Ext: no edema Musculoskeletal:  No deformities, BUE and BLE strength normal and equal Skin: warm and dry  Neuro:  CNs 2-12 intact, no focal abnormalities noted Psych:  Normal affect   EKG:  The EKG was personally reviewed and demonstrates: Sinus tachycardia with low voltage Telemetry:  Telemetry was personally reviewed and demonstrates:    Relevant CV Studies: She had an echocardiogram done yesterday which was personally reviewed by me and showed an EF of 30 to 35% with global hypokinesis.  Laboratory Data:  High Sensitivity Troponin:   Recent Labs  Lab 05/23/22 1731 05/24/22 0309  TROPONINIHS 53* 61*     Chemistry Recent Labs  Lab 05/23/22 1731 05/24/22 0432 05/25/22 0927  NA 137  --  138  K 3.3*  --  3.5  CL 102  --  100  CO2 24  --  31  GLUCOSE 132*  --  131*  BUN 8  --  12  CREATININE 0.68 0.70 0.76  CALCIUM 8.9  --  8.6*  GFRNONAA >60 >60 >60  ANIONGAP 11  --  7    No results for input(s): "PROT", "ALBUMIN", "AST", "ALT", "ALKPHOS", "BILITOT" in the last 168 hours. Lipids No results for input(s): "CHOL", "TRIG", "HDL", "LABVLDL", "LDLCALC", "CHOLHDL" in the last 168 hours.  Hematology Recent Labs  Lab 05/23/22 1731 05/24/22 0432 05/25/22 0927  WBC 15.0* 13.3* 9.4  RBC 5.26* 5.09 4.69  HGB 14.7 14.2 12.9  HCT 45.1 44.3 42.2  MCV 85.7 87.0 90.0  MCH 27.9 27.9 27.5  MCHC 32.6 32.1 30.6  RDW 13.8 13.8 13.9  PLT 229 240 235   Thyroid No results for input(s): "TSH", "FREET4" in the last 168 hours.  BNP Recent Labs  Lab 05/24/22 0307  BNP 825.3*    DDimer No results for input(s): "DDIMER" in the last 168 hours.   Radiology/Studies:  ECHOCARDIOGRAM COMPLETE  Result Date: 05/25/2022  ECHOCARDIOGRAM REPORT   Patient Name:   Pamela Kramer Date of Exam: 05/24/2022 Medical Rec #:  [id number]      Height:       66.0 in Accession #:    [id number]     Weight:       306.0 lb Date of Birth:  16-Aug-1955      BSA:          2.396 m Patient Age:    66 years        BP:           117/49 mmHg Patient Gender: F              HR:           107 bpm. Exam Location:  [facility] Procedure: 2D Echo, Cardiac Doppler and Color Doppler Indications:     Dyspnea R06.00  History:         Patient has prior history of Echocardiogram examinations, most                  recent 03/13/2019. CHF; Risk Factors:Dyslipidemia and                  Hypertension.  Sonographer:     Cristela Blue Referring Phys:  [id number] [facility] AMIN Diagnosing Phys: Alwyn Pea MD  Sonographer Comments: Technically difficult study due to poor echo windows, no apical window and no subcostal window. IMPRESSIONS  1. Technically difficult study.  2. Left ventricular ejection fraction, by estimation, is 30 to 35%. The left ventricle has moderately decreased function. The left ventricle demonstrates global hypokinesis. The left ventricular internal cavity size was moderately dilated. There is mild  left ventricular hypertrophy. Left ventricular diastolic function could not be evaluated.  3. Right ventricular systolic function is mildly reduced. The right ventricular size is mildly enlarged. Mildly increased right ventricular wall thickness.  4. Left atrial size was severely dilated.  5. Right atrial size was mildly dilated.  6. The mitral valve was not well visualized. No evidence of mitral valve regurgitation.  7. The aortic valve was not well visualized. Aortic valve regurgitation is not visualized. Conclusion(s)/Recommendation(s): Poor windows for evaluation of left ventricular function by transthoracic echocardiography. Would recommend an alternative means of evaluation. FINDINGS  Left Ventricle: Left ventricular ejection fraction, by estimation, is 30 to 35%. The left ventricle has moderately decreased function. The left ventricle demonstrates global hypokinesis. The left ventricular internal cavity size was moderately dilated. There is mild left ventricular hypertrophy. Left ventricular diastolic function could not be  evaluated. Right Ventricle: The right ventricular size is mildly enlarged. Mildly increased right ventricular wall thickness. Right ventricular systolic function is mildly reduced. Left Atrium: Left atrial size was severely dilated. Right Atrium: Right atrial size was mildly dilated. Pericardium: The pericardium was not well visualized. Mitral Valve: The mitral valve was not well visualized. No evidence of mitral valve regurgitation. Tricuspid Valve: The tricuspid valve is not well visualized. Tricuspid valve regurgitation is mild. Aortic Valve: The aortic valve was not well visualized. Aortic valve regurgitation is not visualized. Pulmonic Valve: The pulmonic valve was not well visualized. Pulmonic valve regurgitation is not visualized. Aorta: The ascending aorta was not well visualized. IAS/Shunts: No atrial level shunt detected by color flow Doppler. Additional Comments: Technically difficult study.  LEFT VENTRICLE PLAX 2D LVIDd:         5.60 cm LVIDs:         5.30 cm LV PW:  1.50 cm LV IVS:        0.90 cm  LEFT ATRIUM         Index LA diam:    5.20 cm 2.17 cm/m   AORTA Ao Root diam: 3.60 cm Alwyn Pea MD Electronically signed by Alwyn Pea MD Signature Date/Time: 05/25/2022/8:27:22 AM    Final    DG Chest 2 View  Result Date: 05/23/2022 CLINICAL DATA:  Cough.  Shortness of breath.  Fever. EXAM: CHEST - 2 VIEW COMPARISON:  03/15/2019 FINDINGS: Both images are limited by patient body habitus and arm position on the lateral view. Midline trachea. Moderate cardiomegaly. No pleural effusion or pneumothorax. Diffuse interstitial thickening is similar. Right middle and possible right lower lobe airspace disease. IMPRESSION: 1. Right middle and possible right lower lobe airspace disease, suspicious for pneumonia. Followup PA and lateral chest X-ray is recommended in 3-4 weeks following trial of antibiotic therapy to ensure resolution and exclude underlying malignancy. 2. Cardiomegaly and  chronic interstitial thickening. This could relate to pulmonary venous congestion in this nonsmoker. 3. Decreased sensitivity and specificity exam due to technique related factors, as described above. Electronically Signed   By: Jeronimo Greaves M.D.   On: 05/23/2022 17:57     Assessment and Plan:   Influenza A with pneumonia: Seems to be improving with treatment and antibiotics.  Continue supportive care. Acute on chronic systolic heart failure: She was initially diagnosed in 2020 but has not been on medications.  She appears to be mildly volume overloaded and currently getting IV furosemide once daily.  I elected to start her on small dose Toprol, losartan and digoxin.  I discussed with her the importance of taking medications regularly.  The patient never had invasive cardiac evaluation before and I discussed with her the importance of proceeding with a right and left cardiac catheterization at some point in the near future likely as an outpatient to fully evaluate her cardiomyopathy.  Given reported intolerance to multiple heart failure medications, we will have to start with small dosages and introduce medications gradually. Mildly elevated troponin is likely due to supply demand mismatch.  However, she has risk factors for coronary artery disease and given her cardiomyopathy she will require ischemic cardiac evaluation at some point in the near future.   Risk Assessment/Risk Scores:        New York Heart Association (NYHA) Functional Class NYHA Class III  For questions or updates, please contact [facility] Please consult [email or web address] for contact info under    Signed, Lorine Bears, MD  05/25/2022 3:53 PM

## 2022-05-25 NOTE — Assessment & Plan Note (Addendum)
Pressure within goal now.  Initially softer blood pressure most likely secondary to sepsis. -He was started on low-dose losartan and metoprolol by cardiology

## 2022-05-25 NOTE — Assessment & Plan Note (Addendum)
Sepsis Acute dyspnea/impending acute respiratory failure Sepsis criteria include fever, tachycardia, tachypnea, hypotension leukocytosis and pneumonia.  Procalcitonin negative.  Preliminary blood cultures negative Tamiflu Discontinue Rocephin Continue azithromycin  Albuterol as needed, antitussives and supportive care Continue with supplemental oxygen-wean as tolerated

## 2022-05-25 NOTE — Assessment & Plan Note (Signed)
CBG within goal. -Sliding scale insulin

## 2022-05-25 NOTE — Assessment & Plan Note (Signed)
Suspect secondary to demand ischemia from respiratory distress Patient reports mild left-sided chest pressure with EKG unremarkable.  She does have risk factors Continue to trend troponin Echocardiogram-with worsening of EF, dilated cardiomyopathy and global hypokinesis.

## 2022-05-25 NOTE — Progress Notes (Addendum)
Progress Note   Patient: Pamela Kramer QQV:[id number] DOB: 1955-08-28 DOA: 05/24/2022     1 DOS: the patient was seen and examined on 05/25/2022   Brief hospital course: Taken from H&P.  Pamela Kramer is a 66 y.o. female with medical history significant for HTN, HFrEF,diabetes, class III obesity who presents to the ED with a several day history of flulike symptoms of cough, shortness of breath and fever.  She has become increasingly dyspneic with inability to walk from the bed to the doorway without becoming extremely short of breath.  Her husband is currently hospitalized in ICU with influenza. ED course and data review: Tmax 99.8 with pulse 113 and BP down to 97/70, respirations 25 with O2 sat 93% on room air.  Labs significant for WBC of 15,000 troponin 53, potassium 3.3.  Influenza A positive.EKG, personally viewed and interpreted showing sinus tachycardia at 109 with no acute ischemic ST-T wave changes.  Chest x-ray suggestive of pneumonia as further outlined below: IMPRESSION: 1. Right middle and possible right lower lobe airspace disease, suspicious for pneumonia. Followup PA and lateral chest X-ray is recommended in 3-4 weeks following trial of antibiotic therapy to ensure resolution and exclude underlying malignancy. 2. Cardiomegaly and chronic interstitial thickening. This could relate to pulmonary venous congestion in this nonsmoker. 3. Decreased sensitivity and specificity exam due to technique related factors, as described above.  Patient was started on Tamiflu along with ceftriaxone and Zithromax for concern of superadded bacterial infection.  12/27: Vital stable, preliminary blood cultures negative in 12 hours, troponin 53>.61, BNP 825, CBC with some improvement of leukocytosis to 13.3, procalcitonin negative.  Clinically appears volume overload.  Prior echocardiogram done in 2020 with EF of 40 to 45%. DC IV fluid-ordered echocardiogram.  12/28: Later in the day yesterday  patient did had worsening shortness of breath and hypoxia requiring up to 4 L of oxygen.  She was given 1 dose of IV Lasix. Vital stable this morning, saturating 100% on 4 L of oxygen.  Labs stable with resolution of leukocytosis. Echocardiogram with EF of 30 to 35%, dilated cardiomyopathy with global hypokinesis. Patient apparently stopped taking all of her home medications which includes carvedilol and Lasix Patient was unable to lay flat due to shortness of breath when seen today.  Chest with bilateral crackles, she was given another dose of Lasix. Patient will need more diuresis and to restart home medications due to worsening EF. Cardiology was also consulted  Assessment and Plan: * Influenza A with pneumonia Sepsis Acute dyspnea/impending acute respiratory failure Sepsis criteria include fever, tachycardia, tachypnea, hypotension leukocytosis and pneumonia.  Procalcitonin negative.  Preliminary blood cultures negative Tamiflu Discontinue Rocephin Continue azithromycin  Albuterol as needed, antitussives and supportive care Oxygen if O2 sats drop below 92  Sepsis (HCC) -See above  Elevated troponin Suspect secondary to demand ischemia from respiratory distress Patient reports mild left-sided chest pressure with EKG unremarkable.  She does have risk factors Continue to trend troponin Echocardiogram-with worsening of EF, dilated cardiomyopathy and global hypokinesis.  Hypotension Pressure within goal now.  Initially softer blood pressure most likely secondary to sepsis. -Keep holding home antihypertensives-we will restart as needed  Acute on chronic HFrEF (heart failure with reduced ejection fraction) (HCC) Patient with EF of 40 to 45% on echo done in 2020.  Significant lower extremity edema.  Did receive IV fluid due to sepsis.  Elevated BNP Repeat echocardiogram with worsening of EF to 30 to 35%, dilated cardiomyopathy and global hypokinesis.  She did received  a dose of Lasix.   Endorses orthopnea this morning.  Patient has stopped taking her home medications for about a year. -Start her on daily IV Lasix 40 mg.   -Monitor volume status closely -Daily weight and BMP -Strict intake and output  Hypertension associated with diabetes (HCC) Hold home antihypertensives due to softer blood pressure on admission  Type 2 diabetes mellitus with other specified complication (HCC) CBG within goal. -Sliding scale insulin  Obesity, Class III, BMI 40-49.9 (morbid obesity) (HCC) Estimated body mass index is 49.39 kg/m as calculated from the following:   Height as of this encounter: 5\' 6"  (1.676 m).   Weight as of this encounter: 138.8 kg.   Complicating factor to overall prognosis and care   Subjective: Patient was complaining of unable to lay down due to shortness of breath.  She was staying upright in bed.  Still on 4 L of oxygen with no baseline oxygen use.  Physical Exam: Vitals:   05/25/22 0500 05/25/22 0700 05/25/22 0747 05/25/22 1209  BP: 109/77 (!) 115/91  120/88  Pulse: 89 93  (!) 106  Resp: 15 (!) 21  20  Temp:   98 F (36.7 C) 98 F (36.7 C)  TempSrc:   Oral Oral  SpO2: 99% 100%  100%  Weight:      Height:       General.  Morbidly obese lady, in no acute distress. Pulmonary.  Scattered crackles bilaterally, normal respiratory effort. CV.  Regular rate and rhythm, no JVD, rub or murmur. Abdomen.  Soft, nontender, nondistended, BS positive. CNS.  Alert and oriented .  No focal neurologic deficit. Extremities.  2+ LE edema, no cyanosis, pulses intact and symmetrical. Psychiatry.  Judgment and insight appears normal.    Data Reviewed: Prior data reviewed  Family Communication: Talked with son on phone.  Disposition: Status is: Inpatient Remains inpatient appropriate because: Severity of illness  Planned Discharge Destination: Home  Time spent: 45 minutes  This record has been created using 05/27/22. Errors have been  sought and corrected,but may not always be located. Such creation errors do not reflect on the standard of care.   Author: Conservation officer, historic buildings, MD 05/25/2022 2:50 PM  For on call review [email or web address].

## 2022-05-26 LAB — GLUCOSE, CAPILLARY
Glucose-Capillary: 145 mg/dL — ABNORMAL HIGH (ref 70–99)
Glucose-Capillary: 118 mg/dL — ABNORMAL HIGH (ref 70–99)
Glucose-Capillary: 95 mg/dL (ref 70–99)
Glucose-Capillary: 133 mg/dL — ABNORMAL HIGH (ref 70–99)

## 2022-05-26 LAB — BASIC METABOLIC PANEL
Anion gap: 11 (ref 5–15)
BUN: 16 mg/dL (ref 8–23)
CO2: 28 mmol/L (ref 22–32)
Calcium: 8.8 mg/dL — ABNORMAL LOW (ref 8.9–10.3)
Chloride: 99 mmol/L (ref 98–111)
Creatinine, Ser: 0.75 mg/dL (ref 0.44–1.00)
GFR, Estimated: >60 mL/min (ref >60)
Glucose, Bld: 120 mg/dL — ABNORMAL HIGH (ref 70–99)
Potassium: 3.7 mmol/L (ref 3.5–5.1)
Sodium: 138 mmol/L (ref 135–145)

## 2022-05-26 MED ORDER — SALINE SPRAY 0.65 % NA SOLN
1.0000 | NASAL | Status: DC | PRN
  Administered 2022-05-26 (×2): 1 via NASAL
  Filled 2022-05-26: qty 44

## 2022-05-26 NOTE — Progress Notes (Signed)
Rounding Note    Patient Name: Pamela Kramer Date of Encounter: 05/26/2022  [facility] Cardiologist: new Kirke Corin)  Subjective   She reports improvement in shortness of breath.  Hypoxia is improving.  Inpatient Medications    Scheduled Meds:  albuterol  2.5 mg Nebulization Q6H   azithromycin  500 mg Oral QHS   digoxin  0.125 mg Oral Daily   enoxaparin (LOVENOX) injection  0.5 mg/kg Subcutaneous Q24H   furosemide  40 mg Intravenous Daily   guaiFENesin  600 mg Oral BID   insulin aspart  0-20 Units Subcutaneous TID WC   insulin aspart  0-5 Units Subcutaneous QHS   losartan  12.5 mg Oral Daily   metoprolol succinate  25 mg Oral Daily   oseltamivir  75 mg Oral BID   Continuous Infusions:  PRN Meds: acetaminophen **OR** acetaminophen, guaiFENesin-dextromethorphan, HYDROcodone-acetaminophen, ondansetron **OR** ondansetron (ZOFRAN) IV   Vital Signs    Vitals:   05/26/22 0801 05/26/22 0935 05/26/22 1015 05/26/22 1100  BP:  99/65 94/70   Pulse:  81 84   Resp:  16 20   Temp:  97.8 F (36.6 C)    TempSrc:      SpO2: 97% 99% 99% 99%  Weight:      Height:        Intake/Output Summary (Last 24 hours) at 05/26/2022 1128 Last data filed at 05/25/2022 1801 Gross per 24 hour  Intake 362.5 ml  Output --  Net 362.5 ml      05/23/2022    5:28 PM 04/06/2021    3:22 PM 11/09/2020    8:50 AM  Last 3 Weights  Weight (lbs) 306 lb 306 lb 3 oz 314 lb 2 oz  Weight (kg) 138.801 kg 138.886 kg 142.486 kg      Telemetry    Sinus rhythm with sinus tachycardia and short runs of SVT- Personally Reviewed  ECG     - Personally Reviewed  Physical Exam   GEN: No acute distress.   Neck: Jugular venous pressure is not well-visualized Cardiac: RRR, no murmurs, rubs, or gallops.  Respiratory: Diminished breath sounds bilaterally. GI: Soft, nontender, non-distended  MS: No edema; No deformity. Neuro:  Nonfocal  Psych: Normal affect   Labs    High Sensitivity  Troponin:   Recent Labs  Lab 05/23/22 1731 05/24/22 0309  TROPONINIHS 53* 61*     Chemistry Recent Labs  Lab 05/23/22 1731 05/24/22 0432 05/25/22 0927 05/26/22 0544  NA 137  --  138 138  K 3.3*  --  3.5 3.7  CL 102  --  100 99  CO2 24  --  31 28  GLUCOSE 132*  --  131* 120*  BUN 8  --  12 16  CREATININE 0.68 0.70 0.76 0.75  CALCIUM 8.9  --  8.6* 8.8*  GFRNONAA >60 >60 >60 >60  ANIONGAP 11  --  7 11    Lipids No results for input(s): "CHOL", "TRIG", "HDL", "LABVLDL", "LDLCALC", "CHOLHDL" in the last 168 hours.  Hematology Recent Labs  Lab 05/23/22 1731 05/24/22 0432 05/25/22 0927  WBC 15.0* 13.3* 9.4  RBC 5.26* 5.09 4.69  HGB 14.7 14.2 12.9  HCT 45.1 44.3 42.2  MCV 85.7 87.0 90.0  MCH 27.9 27.9 27.5  MCHC 32.6 32.1 30.6  RDW 13.8 13.8 13.9  PLT 229 240 235   Thyroid No results for input(s): "TSH", "FREET4" in the last 168 hours.  BNP Recent Labs  Lab 05/24/22 0307  BNP  825.3*    DDimer No results for input(s): "DDIMER" in the last 168 hours.   Radiology    ECHOCARDIOGRAM COMPLETE  Result Date: 05/25/2022    ECHOCARDIOGRAM REPORT   Patient Name:   Pamela Kramer Date of Exam: 05/24/2022 Medical Rec #:  [id number]      Height:       66.0 in Accession #:    [id number]     Weight:       306.0 lb Date of Birth:  [birth year]      BSA:          2.396 m Patient Age:    66 years       BP:           117/49 mmHg Patient Gender: F              HR:           107 bpm. Exam Location:  [facility] Procedure: 2D Echo, Cardiac Doppler and Color Doppler Indications:     Dyspnea R06.00  History:         Patient has prior history of Echocardiogram examinations, most                  recent 03/13/2019. CHF; Risk Factors:Dyslipidemia and                  Hypertension.  Sonographer:     Cristela Blue Referring Phys:  [id number] [facility] AMIN Diagnosing Phys: Alwyn Pea MD  Sonographer Comments: Technically difficult study due to poor echo windows, no apical window and no subcostal window.  IMPRESSIONS  1. Technically difficult study.  2. Left ventricular ejection fraction, by estimation, is 30 to 35%. The left ventricle has moderately decreased function. The left ventricle demonstrates global hypokinesis. The left ventricular internal cavity size was moderately dilated. There is mild  left ventricular hypertrophy. Left ventricular diastolic function could not be evaluated.  3. Right ventricular systolic function is mildly reduced. The right ventricular size is mildly enlarged. Mildly increased right ventricular wall thickness.  4. Left atrial size was severely dilated.  5. Right atrial size was mildly dilated.  6. The mitral valve was not well visualized. No evidence of mitral valve regurgitation.  7. The aortic valve was not well visualized. Aortic valve regurgitation is not visualized. Conclusion(s)/Recommendation(s): Poor windows for evaluation of left ventricular function by transthoracic echocardiography. Would recommend an alternative means of evaluation. FINDINGS  Left Ventricle: Left ventricular ejection fraction, by estimation, is 30 to 35%. The left ventricle has moderately decreased function. The left ventricle demonstrates global hypokinesis. The left ventricular internal cavity size was moderately dilated. There is mild left ventricular hypertrophy. Left ventricular diastolic function could not be evaluated. Right Ventricle: The right ventricular size is mildly enlarged. Mildly increased right ventricular wall thickness. Right ventricular systolic function is mildly reduced. Left Atrium: Left atrial size was severely dilated. Right Atrium: Right atrial size was mildly dilated. Pericardium: The pericardium was not well visualized. Mitral Valve: The mitral valve was not well visualized. No evidence of mitral valve regurgitation. Tricuspid Valve: The tricuspid valve is not well visualized. Tricuspid valve regurgitation is mild. Aortic Valve: The aortic valve was not well visualized. Aortic  valve regurgitation is not visualized. Pulmonic Valve: The pulmonic valve was not well visualized. Pulmonic valve regurgitation is not visualized. Aorta: The ascending aorta was not well visualized. IAS/Shunts: No atrial level shunt detected by color flow Doppler. Additional Comments: Technically difficult study.  LEFT VENTRICLE PLAX  2D LVIDd:         5.60 cm LVIDs:         5.30 cm LV PW:         1.50 cm LV IVS:        0.90 cm  LEFT ATRIUM         Index LA diam:    5.20 cm 2.17 cm/m   AORTA Ao Root diam: 3.60 cm Alwyn Pea MD Electronically signed by Alwyn Pea MD Signature Date/Time: 05/25/2022/8:27:22 AM    Final     Cardiac Studies   Echocardiogram done on 2025-01-19showed an EF of 30 to 35% with no significant valvular abnormalities.  Patient Profile     66 y.o. female with hx of chronic systolic heart failure, type 2 diabetes and obesity who is being seen 05/25/2022 for the evaluation of heart failure at the request of Dr. Nelson Chimes.   Assessment & Plan    1.  Influenza A: Seems to be improving with Tamiflu and supportive care.  2.  Acute on chronic systolic heart failure: Previous diagnosis of cardiomyopathy in 2020 with an EF of 35 to 40%.  Nuclear stress test at that time showed no evidence of ischemia.  She had poor tolerance to most heart failure medications mostly due to low blood pressure.  Echocardiogram during this admission showed an EF of 30 to 35%.  Evaluating her volume status by exam is somewhat difficult due to her morbid obesity.  Continue furosemide 40 mg intravenously once daily and can likely switch to oral by tomorrow. I started the patient on small dose Toprol, losartan and digoxin yesterday.  Again we seem to be limited by low blood pressure.  Not able to increase the dosages.  I think she is a poor candidate for an SGLT2 inhibitor given increased risk of urinary tract infections. The patient benefits from a right and left cardiac catheterization after hospital  discharge once she recovers from her respiratory illness.       For questions or updates, please contact [facility] Please consult [email or web address] for contact info under        Signed, Lorine Bears, MD  05/26/2022, 11:28 AM

## 2022-05-26 NOTE — Care Management Important Message (Signed)
Important Message  Patient Details  Name: Pamela Kramer MRN: [id number] Date of Birth: [birth year]   Medicare Important Message Given:  N/A - LOS <3 / Initial given by admissions     Olegario Messier A Jenean Escandon 05/26/2022, 8:19 AM

## 2022-05-26 NOTE — Progress Notes (Signed)
Progress Note   Patient: Pamela Kramer [id number] DOB: [birth year] DOA: 05/24/2022     2 DOS: the patient was seen and examined on 05/26/2022   Brief hospital course: Taken from H&P.  Teyana Scharf is a 66 y.o. female with medical history significant for HTN, HFrEF,diabetes, class III obesity who presents to the ED with a several day history of flulike symptoms of cough, shortness of breath and fever.  She has become increasingly dyspneic with inability to walk from the bed to the doorway without becoming extremely short of breath.  Her husband is currently hospitalized in ICU with influenza. ED course and data review: Tmax 99.8 with pulse 113 and BP down to 97/70, respirations 25 with O2 sat 93% on room air.  Labs significant for WBC of 15,000 troponin 53, potassium 3.3.  Influenza A positive.EKG, personally viewed and interpreted showing sinus tachycardia at 109 with no acute ischemic ST-T wave changes.  Chest x-ray suggestive of pneumonia as further outlined below: IMPRESSION: 1. Right middle and possible right lower lobe airspace disease, suspicious for pneumonia. Followup PA and lateral chest X-ray is recommended in 3-4 weeks following trial of antibiotic therapy to ensure resolution and exclude underlying malignancy. 2. Cardiomegaly and chronic interstitial thickening. This could relate to pulmonary venous congestion in this nonsmoker. 3. Decreased sensitivity and specificity exam due to technique related factors, as described above.  Patient was started on Tamiflu along with ceftriaxone and Zithromax for concern of superadded bacterial infection.  12/27: Vital stable, preliminary blood cultures negative in 12 hours, troponin 53>.61, BNP 825, CBC with some improvement of leukocytosis to 13.3, procalcitonin negative.  Clinically appears volume overload.  Prior echocardiogram done in 2020 with EF of 40 to 45%. DC IV fluid-ordered echocardiogram.  12/28: Later in the day yesterday  patient did had worsening shortness of breath and hypoxia requiring up to 4 L of oxygen.  She was given 1 dose of IV Lasix. Vital stable this morning, saturating 100% on 4 L of oxygen.  Labs stable with resolution of leukocytosis. Echocardiogram with EF of 30 to 35%, dilated cardiomyopathy with global hypokinesis. Patient apparently stopped taking all of her home medications which includes carvedilol and Lasix Patient was unable to lay flat due to shortness of breath when seen today.  Chest with bilateral crackles, she was given another dose of Lasix. Patient will need more diuresis and to restart home medications due to worsening EF. Cardiology was also consulted.  12/29: Vitals and labs stable.  Cardiology started her on low-dose digoxin, losartan and metoprolol.  Patient need ischemic workup with right and left cardiac catheterization which was never done before.  Blood pressure later becomes little soft so morning dose of losartan and metoprolol was held.  Remained on 3 to 4 L of oxygen with no baseline oxygen use.  Assessment and Plan: * Influenza A with pneumonia Sepsis Acute dyspnea/impending acute respiratory failure Sepsis criteria include fever, tachycardia, tachypnea, hypotension leukocytosis and pneumonia.  Procalcitonin negative.  Preliminary blood cultures negative Tamiflu Discontinue Rocephin Continue azithromycin  Albuterol as needed, antitussives and supportive care Oxygen if O2 sats drop below 92  Sepsis ([place]) -See above  Elevated troponin Suspect secondary to demand ischemia from respiratory distress Patient reports mild left-sided chest pressure with EKG unremarkable.  She does have risk factors Continue to trend troponin Echocardiogram-with worsening of EF, dilated cardiomyopathy and global hypokinesis.  Hypotension Pressure within goal now.  Initially softer blood pressure most likely secondary to sepsis. -Keep holding home antihypertensives-we will  restart as  needed  Acute on chronic HFrEF (heart failure with reduced ejection fraction) (HCC) Patient with EF of 40 to 45% on echo done in 2020.  Significant lower extremity edema.  Did receive IV fluid due to sepsis.  Elevated BNP Repeat echocardiogram with worsening of EF to 30 to 35%, dilated cardiomyopathy and global hypokinesis.  She did received a dose of Lasix.  Endorses orthopnea this morning.  Patient has stopped taking her home medications for about a year. -Start her on daily IV Lasix 40 mg.  -Cardiology added low-dose digoxin, losartan and metoprolol -Monitor volume status closely -Daily weight and BMP -Strict intake and output -Patient will need ischemic workup with right and left cardiac catheterization which can be done as outpatient per cardiology  Hypertension associated with diabetes [facility]) Cardiology started low-dose metoprolol and losartan which is being held today due to borderline blood pressure.  Type 2 diabetes mellitus with other specified complication (HCC) CBG within goal. -Sliding scale insulin  Obesity, Class III, BMI 40-49.9 (morbid obesity) (HCC) Estimated body mass index is 49.39 kg/m as calculated from the following:   Height as of this encounter: 5\' 6"  (1.676 m).   Weight as of this encounter: 138.8 kg.   Complicating factor to overall prognosis and care   Subjective: She continued to have some dyspnea and orthopnea.  Remains on 3 to 4 L of oxygen with no baseline oxygen use.  Physical Exam: Vitals:   05/26/22 0935 05/26/22 1015 05/26/22 1100 05/26/22 1307  BP: 99/65 94/70    Pulse: 81 84    Resp: 16 20    Temp: 97.8 F (36.6 C)     TempSrc:      SpO2: 99% 99% 99% 97%  Weight:      Height:       General.  Morbidly obese lady, in no acute distress. Pulmonary.  Few basal crackles bilaterally, normal respiratory effort. CV.  Regular rate and rhythm, no JVD, rub or murmur. Abdomen.  Soft, nontender, nondistended, BS positive. CNS.  Alert and oriented  .  No focal neurologic deficit. Extremities.  1+ LE edema, no cyanosis, pulses intact and symmetrical. Psychiatry.  Judgment and insight appears normal.    Data Reviewed: Prior data reviewed  Family Communication: Talked with son on phone.  Disposition: Status is: Inpatient Remains inpatient appropriate because: Severity of illness  Planned Discharge Destination: Home  Time spent: 40 minutes  This record has been created using 05/28/22. Errors have been sought and corrected,but may not always be located. Such creation errors do not reflect on the standard of care.   Author: Conservation officer, historic buildings, MD 05/26/2022 2:52 PM  For on call review [email or web address].

## 2022-05-26 NOTE — TOC Progression Note (Signed)
Transition of Care [facility]) - Progression Note    Patient Details  Name: Pamela Kramer MRN: [id number] Date of Birth: 1955/06/28  Transition of Care [facility]) CM/SW Contact  Marlowe Sax, RN Phone Number: 05/26/2022, 11:19 AM  Clinical Narrative:   The patient lives at home with her husband  Her husband is currently in ICU with the Flu She is now requiring 4 Liters of Oxygen acutely She has went to Cardiac rehab but stopped Per care Everywhere she goes to [facility] for PCP [address] [place], [place] [address]  Pharmacy is [facility] to continue to follow for needs     Expected Discharge Plan: Home w Home Health Services Barriers to Discharge: Continued Medical Work up  Expected Discharge Plan and Services   Discharge Planning Services: CM Consult   Living arrangements for the past 2 months: Single Family Home                                       Social Determinants of Health (SDOH) Interventions SDOH Screenings   Food Insecurity: No Food Insecurity (05/25/2022)  Housing: Low Risk  (05/25/2022)  Transportation Needs: No Transportation Needs (05/25/2022)  Utilities: Not At Risk (05/25/2022)  Financial Resource Strain: Low Risk  (03/28/2019)  Physical Activity: Unknown (03/28/2019)  Social Connections: Unknown (03/28/2019)  Stress: No Stress Concern Present (03/28/2019)  Tobacco Use: Low Risk  (05/23/2022)    Readmission Risk Interventions     No data to display

## 2022-05-26 NOTE — Assessment & Plan Note (Addendum)
Cardiology started low-dose metoprolol and losartan

## 2022-05-27 LAB — BASIC METABOLIC PANEL
Anion gap: 13 (ref 5–15)
BUN: 15 mg/dL (ref 8–23)
CO2: 26 mmol/L (ref 22–32)
Calcium: 9.1 mg/dL (ref 8.9–10.3)
Chloride: 99 mmol/L (ref 98–111)
Creatinine, Ser: 0.65 mg/dL (ref 0.44–1.00)
GFR, Estimated: >60 mL/min (ref >60)
Glucose, Bld: 158 mg/dL — ABNORMAL HIGH (ref 70–99)
Potassium: 3.5 mmol/L (ref 3.5–5.1)
Sodium: 138 mmol/L (ref 135–145)

## 2022-05-27 LAB — GLUCOSE, CAPILLARY
Glucose-Capillary: 93 mg/dL (ref 70–99)
Glucose-Capillary: 139 mg/dL — ABNORMAL HIGH (ref 70–99)
Glucose-Capillary: 117 mg/dL — ABNORMAL HIGH (ref 70–99)
Glucose-Capillary: 128 mg/dL — ABNORMAL HIGH (ref 70–99)

## 2022-05-27 LAB — DIGOXIN LEVEL: Digoxin Level: <0.2 ng/mL — ABNORMAL LOW (ref 0.8–2.0)

## 2022-05-27 MED ORDER — IPRATROPIUM-ALBUTEROL 0.5-2.5 (3) MG/3ML IN SOLN
3.0000 mL | Freq: Three times a day (TID) | RESPIRATORY_TRACT | Status: DC
  Administered 2022-05-27 – 2022-05-28 (×4): 3 mL via RESPIRATORY_TRACT
  Filled 2022-05-27 (×4): qty 3

## 2022-05-27 MED ORDER — ALBUTEROL SULFATE (2.5 MG/3ML) 0.083% IN NEBU
2.5000 mg | INHALATION_SOLUTION | RESPIRATORY_TRACT | Status: DC | PRN
  Filled 2022-05-27: qty 3

## 2022-05-27 NOTE — Progress Notes (Signed)
Progress Note   Patient: Pamela Kramer YBW:[id number] DOB: 26-Mar-1956 DOA: 05/24/2022     3 DOS: the patient was seen and examined on 05/27/2022   Brief hospital course: Taken from H&P.  Shelanda Duvall is a 66 y.o. female with medical history significant for HTN, HFrEF,diabetes, class III obesity who presents to the ED with a several day history of flulike symptoms of cough, shortness of breath and fever.  She has become increasingly dyspneic with inability to walk from the bed to the doorway without becoming extremely short of breath.  Her husband is currently hospitalized in ICU with influenza. ED course and data review: Tmax 99.8 with pulse 113 and BP down to 97/70, respirations 25 with O2 sat 93% on room air.  Labs significant for WBC of 15,000 troponin 53, potassium 3.3.  Influenza A positive.EKG, personally viewed and interpreted showing sinus tachycardia at 109 with no acute ischemic ST-T wave changes.  Chest x-ray suggestive of pneumonia as further outlined below: IMPRESSION: 1. Right middle and possible right lower lobe airspace disease, suspicious for pneumonia. Followup PA and lateral chest X-ray is recommended in 3-4 weeks following trial of antibiotic therapy to ensure resolution and exclude underlying malignancy. 2. Cardiomegaly and chronic interstitial thickening. This could relate to pulmonary venous congestion in this nonsmoker. 3. Decreased sensitivity and specificity exam due to technique related factors, as described above.  Patient was started on Tamiflu along with ceftriaxone and Zithromax for concern of superadded bacterial infection.  12/27: Vital stable, preliminary blood cultures negative in 12 hours, troponin 53>.61, BNP 825, CBC with some improvement of leukocytosis to 13.3, procalcitonin negative.  Clinically appears volume overload.  Prior echocardiogram done in 2020 with EF of 40 to 45%. DC IV fluid-ordered echocardiogram.  12/28: Later in the day yesterday  patient did had worsening shortness of breath and hypoxia requiring up to 4 L of oxygen.  She was given 1 dose of IV Lasix. Vital stable this morning, saturating 100% on 4 L of oxygen.  Labs stable with resolution of leukocytosis. Echocardiogram with EF of 30 to 35%, dilated cardiomyopathy with global hypokinesis. Patient apparently stopped taking all of her home medications which includes carvedilol and Lasix Patient was unable to lay flat due to shortness of breath when seen today.  Chest with bilateral crackles, she was given another dose of Lasix. Patient will need more diuresis and to restart home medications due to worsening EF. Cardiology was also consulted.  12/29: Vitals and labs stable.  Cardiology started her on low-dose digoxin, losartan and metoprolol.  Patient need ischemic workup with right and left cardiac catheterization which was never done before.  Blood pressure later becomes little soft so morning dose of losartan and metoprolol was held.  Remained on 3 to 4 L of oxygen with no baseline oxygen use.  12/30: Hemodynamically stable.  Still needing 2 L of oxygen.  Patient will be ambulated with pulse ox to determine the home oxygen requirement today.  Still no urinary output recorded, weight decreased to 276 from 206 which was recorded on 05/23/2022. Orthopnea and dyspnea seems improving.  BMP stable.  Will continue with IV diuresis for another day.  Assessment and Plan: * Influenza A with pneumonia Sepsis Acute dyspnea/impending acute respiratory failure Sepsis criteria include fever, tachycardia, tachypnea, hypotension leukocytosis and pneumonia.  Procalcitonin negative.  Preliminary blood cultures negative Tamiflu Discontinue Rocephin Continue azithromycin  Albuterol as needed, antitussives and supportive care Continue with supplemental oxygen-wean as tolerated  Sepsis (HCC) -See above  Elevated troponin Suspect secondary to demand ischemia from respiratory  distress Patient reports mild left-sided chest pressure with EKG unremarkable.  She does have risk factors Continue to trend troponin Echocardiogram-with worsening of EF, dilated cardiomyopathy and global hypokinesis.  Hypotension Pressure within goal now.  Initially softer blood pressure most likely secondary to sepsis. -He was started on low-dose losartan and metoprolol by cardiology  Acute on chronic HFrEF (heart failure with reduced ejection fraction) ([place]) Patient with EF of 40 to 45% on echo done in 2020.  Significant lower extremity edema.  Did receive IV fluid due to sepsis.  Elevated BNP Repeat echocardiogram with worsening of EF to 30 to 35%, dilated cardiomyopathy and global hypokinesis.  She did received a dose of Lasix.  Endorses orthopnea this morning.  Patient has stopped taking her home medications for about a year. -Start her on daily IV Lasix 40 mg-will continue for another day -Cardiology added low-dose digoxin, losartan and metoprolol -Monitor volume status closely -Daily weight and BMP -Strict intake and output -Patient will need ischemic workup with right and left cardiac catheterization which can be done as outpatient per cardiology  Hypertension associated with diabetes [facility]) Cardiology started low-dose metoprolol and losartan   Type 2 diabetes mellitus with other specified complication ([place]) CBG within goal. -Sliding scale insulin  Obesity, Class III, BMI 40-49.9 (morbid obesity) ([place]) Estimated body mass index is 49.39 kg/m as calculated from the following:   Height as of this encounter: 5\' 6"  (1.676 m).   Weight as of this encounter: 138.8 kg.   Complicating factor to overall prognosis and care   Subjective: Patient was seen and examined today.  Shortness of breath seems improving, she was able to lay down for some time last night.  Still on 2 L of oxygen.  Physical Exam: Vitals:   05/26/22 1739 05/27/22 0014 05/27/22 0500 05/27/22 0855  BP:  97/60   105/65  Pulse:  94  81  Resp:  18  18  Temp:  98.5 F (36.9 C)  97.8 F (36.6 C)  TempSrc:      SpO2: 99% 98%  90%  Weight:   125.5 kg   Height:       General.  Morbidly obese lady, in no acute distress. Pulmonary.  Lungs clear bilaterally, normal respiratory effort. CV.  Regular rate and rhythm, no JVD, rub or murmur. Abdomen.  Soft, nontender, nondistended, BS positive. CNS.  Alert and oriented .  No focal neurologic deficit. Extremities.  Trace LE edema, no cyanosis, pulses intact and symmetrical. Psychiatry.  Judgment and insight appears normal.    Data Reviewed: Prior data reviewed  Family Communication: Discussed with son on phone.  Disposition: Status is: Inpatient Remains inpatient appropriate because: Severity of illness  Planned Discharge Destination: Home  Time spent: 41 minutes  This record has been created using Systems analyst. Errors have been sought and corrected,but may not always be located. Such creation errors do not reflect on the standard of care.   Author: Lorella Nimrod, MD 05/27/2022 3:19 PM  For on call review [email or web address]

## 2022-05-27 NOTE — Plan of Care (Signed)
  Problem: Education: Goal: Ability to describe self-care measures that may prevent or decrease complications (Diabetes Survival Skills Education) will improve Outcome: Progressing   Problem: Coping: Goal: Ability to adjust to condition or change in health will improve Outcome: Progressing   Problem: Fluid Volume: Goal: Ability to maintain a balanced intake and output will improve Outcome: Progressing   Problem: Health Behavior/Discharge Planning: Goal: Ability to identify and utilize available resources and services will improve Outcome: Progressing Goal: Ability to manage health-related needs will improve Outcome: Progressing   Problem: Metabolic: Goal: Ability to maintain appropriate glucose levels will improve Outcome: Progressing   Problem: Nutritional: Goal: Maintenance of adequate nutrition will improve Outcome: Progressing Goal: Progress toward achieving an optimal weight will improve Outcome: Progressing   Problem: Skin Integrity: Goal: Risk for impaired skin integrity will decrease Outcome: Progressing   Problem: Tissue Perfusion: Goal: Adequacy of tissue perfusion will improve Outcome: Progressing   Problem: Fluid Volume: Goal: Hemodynamic stability will improve Outcome: Progressing   Problem: Clinical Measurements: Goal: Diagnostic test results will improve Outcome: Progressing Goal: Signs and symptoms of infection will decrease Outcome: Progressing   Problem: Respiratory: Goal: Ability to maintain adequate ventilation will improve Outcome: Progressing   Problem: Education: Goal: Knowledge of General Education information will improve Description: Including pain rating scale, medication(s)/side effects and non-pharmacologic comfort measures Outcome: Progressing   Problem: Health Behavior/Discharge Planning: Goal: Ability to manage health-related needs will improve Outcome: Progressing   Problem: Clinical Measurements: Goal: Ability to maintain  clinical measurements within normal limits will improve Outcome: Progressing Goal: Will remain free from infection Outcome: Progressing Goal: Diagnostic test results will improve Outcome: Progressing Goal: Respiratory complications will improve Outcome: Progressing Goal: Cardiovascular complication will be avoided Outcome: Progressing   Problem: Activity: Goal: Risk for activity intolerance will decrease Outcome: Progressing   Problem: Nutrition: Goal: Adequate nutrition will be maintained Outcome: Progressing   Problem: Coping: Goal: Level of anxiety will decrease Outcome: Progressing   Problem: Elimination: Goal: Will not experience complications related to bowel motility Outcome: Progressing Goal: Will not experience complications related to urinary retention Outcome: Progressing   Problem: Pain Managment: Goal: General experience of comfort will improve Outcome: Progressing   Problem: Safety: Goal: Ability to remain free from injury will improve Outcome: Progressing   Problem: Skin Integrity: Goal: Risk for impaired skin integrity will decrease Outcome: Progressing   

## 2022-05-27 NOTE — Plan of Care (Signed)
  Problem: Coping: Goal: Ability to adjust to condition or change in health will improve Outcome: Progressing   Problem: Nutritional: Goal: Maintenance of adequate nutrition will improve Outcome: Progressing   Problem: Fluid Volume: Goal: Hemodynamic stability will improve Outcome: Progressing   Problem: Respiratory: Goal: Ability to maintain adequate ventilation will improve Outcome: Progressing

## 2022-05-27 NOTE — Plan of Care (Signed)
  Problem: Fluid Volume: Goal: Hemodynamic stability will improve Outcome: Progressing   Problem: Respiratory: Goal: Ability to maintain adequate ventilation will improve Outcome: Progressing   Problem: Pain Managment: Goal: General experience of comfort will improve Outcome: Progressing   Problem: Safety: Goal: Ability to remain free from injury will improve Outcome: Progressing

## 2022-05-27 NOTE — Progress Notes (Signed)
Rounding Note    Patient Name: Pamela Kramer Date of Encounter: 05/27/2022  [place] Cardiologist: None   Subjective   Dyspnea improved but still not back to baseline.  Inpatient Medications    Scheduled Meds:  azithromycin  500 mg Oral QHS   digoxin  0.125 mg Oral Daily   enoxaparin (LOVENOX) injection  0.5 mg/kg Subcutaneous Q24H   furosemide  40 mg Intravenous Daily   guaiFENesin  600 mg Oral BID   insulin aspart  0-20 Units Subcutaneous TID WC   insulin aspart  0-5 Units Subcutaneous QHS   ipratropium-albuterol  3 mL Nebulization TID   losartan  12.5 mg Oral Daily   metoprolol succinate  25 mg Oral Daily   oseltamivir  75 mg Oral BID   Continuous Infusions:  PRN Meds: acetaminophen **OR** acetaminophen, albuterol, guaiFENesin-dextromethorphan, HYDROcodone-acetaminophen, ondansetron **OR** ondansetron (ZOFRAN) IV, sodium chloride   Vital Signs    Vitals:   05/26/22 1739 05/27/22 0014 05/27/22 0500 05/27/22 0855  BP:  97/60  105/65  Pulse:  94  81  Resp:  18  18  Temp:  98.5 F (36.9 C)  97.8 F (36.6 C)  TempSrc:      SpO2: 99% 98%  90%  Weight:   125.5 kg   Height:        Intake/Output Summary (Last 24 hours) at 05/27/2022 1141 Last data filed at 05/26/2022 1300 Gross per 24 hour  Intake 240 ml  Output --  Net 240 ml      05/27/2022    5:00 AM 05/23/2022    5:28 PM 04/06/2021    3:22 PM  Last 3 Weights  Weight (lbs) 276 lb 10.8 oz 306 lb 306 lb 3 oz  Weight (kg) 125.5 kg 138.801 kg 138.886 kg      Telemetry    Nsr/sinus tachy - Personally Reviewed  ECG    none - Personally Reviewed  Physical Exam   GEN: morbidly obese, no acute distress.   Neck: No JVD Cardiac: RRR, no murmurs, rubs, or gallops.  Respiratory: Clear to auscultation bilaterally. GI: Soft, nontender, non-distended  MS: No edema; No deformity. Neuro:  Nonfocal  Psych: Normal affect   Labs    High Sensitivity Troponin:   Recent Labs  Lab  05/23/22 1731 05/24/22 0309  TROPONINIHS 53* 61*     Chemistry Recent Labs  Lab 05/25/22 0927 05/26/22 0544 05/27/22 1059  NA 138 138 138  K 3.5 3.7 3.5  CL 100 99 99  CO2 31 28 26   GLUCOSE 131* 120* 158*  BUN 12 16 15   CREATININE 0.76 0.75 0.65  CALCIUM 8.6* 8.8* 9.1  GFRNONAA >60 >60 >60  ANIONGAP 7 11 13     Lipids No results for input(s): "CHOL", "TRIG", "HDL", "LABVLDL", "LDLCALC", "CHOLHDL" in the last 168 hours.  Hematology Recent Labs  Lab 05/23/22 1731 05/24/22 0432 05/25/22 0927  WBC 15.0* 13.3* 9.4  RBC 5.26* 5.09 4.69  HGB 14.7 14.2 12.9  HCT 45.1 44.3 42.2  MCV 85.7 87.0 90.0  MCH 27.9 27.9 27.5  MCHC 32.6 32.1 30.6  RDW 13.8 13.8 13.9  PLT 229 240 235   Thyroid No results for input(s): "TSH", "FREET4" in the last 168 hours.  BNP Recent Labs  Lab 05/24/22 0307  BNP 825.3*    DDimer No results for input(s): "DDIMER" in the last 168 hours.   Radiology    No results found.  Cardiac Studies    Echo 04/2022 1. Technically  difficult study.   2. Left ventricular ejection fraction, by estimation, is 30 to 35%. The  left ventricle has moderately decreased function. The left ventricle  demonstrates global hypokinesis. The left ventricular internal cavity size  was moderately dilated. There is mild   left ventricular hypertrophy. Left ventricular diastolic function could  not be evaluated.   3. Right ventricular systolic function is mildly reduced. The right  ventricular size is mildly enlarged. Mildly increased right ventricular  wall thickness.   4. Left atrial size was severely dilated.   5. Right atrial size was mildly dilated.   6. The mitral valve was not well visualized. No evidence of mitral valve  regurgitation.   7. The aortic valve was not well visualized. Aortic valve regurgitation  is not visualized.   Echo 02/2019  1. Left ventricular ejection fraction, by visual estimation, is 35 to  40%. The left ventricle has moderately  decreased function. Normal left  ventricular size. Left ventricular septal wall thickness was normal. There  is no left ventricular hypertrophy.   2. Global right ventricle has mildly reduced systolic function.The right  ventricular size is normal. Mildly increased right ventricular wall  thickness.   3. Left atrial size was mildly dilated.   4. Right atrial size was mildly dilated.   5. The mitral valve is grossly normal. Mild mitral valve regurgitation.   6. The tricuspid valve is grossly normal. Tricuspid valve regurgitation  is mild.   7. The aortic valve is normal in structure. Aortic valve regurgitation is  trivial by color flow Doppler.   8. The pulmonic valve was grossly normal. Pulmonic valve regurgitation is  not visualized by color flow Doppler.   Patient Profile     66 y.o. female with hx of chronic systolic heart failure, type 2 diabetes and obesity who is being seen 05/25/2022 for the evaluation of heart failure.  Assessment & Plan    Influenza A - supportive care per IM  Acute on chronic systolic heart failure - previous dx of cardiomyopathy in 2020 with an EF 35-40%. Nuclear stress test at the time showed no ischemia.  - she had poor tolerance to heart failure medications due to low BP - Echo this admission showed LVEF 30-35% - volume status difficult to assess given obesity though I suspect that this is better. - continue lasix 40mg  daily, can possible switch to oral lasix tomorrow - continue Toprol, Losartan and digoxin - poor candidate for SGLT2i with increased risk of UTIs - plan for R/L heart cath once she has recovered form respiratory illness  For questions or updates, please contact [facility] Please consult [email or web address] for contact info under        Signed,  , MD

## 2022-05-27 NOTE — Progress Notes (Signed)
SATURATION QUALIFICATIONS: (This note is used to comply with regulatory documentation for home oxygen)  Patient Saturations on Room Air at Rest = 93%  Patient Saturations on Room Air while Ambulating = 88%  Patient Saturations on 3 Liters of oxygen while Ambulating = 97%  Please briefly explain why patient needs home oxygen: patient oxygen while ambulating   desats in the 88% on RA.Oxygen administered at 2L/m,Sats remain in 90-91% and when   oxygen administered at 3L,Sats upto 96-97%.

## 2022-05-28 DIAGNOSIS — R6510 Systemic inflammatory response syndrome (SIRS) of non-infectious origin without acute organ dysfunction: Secondary | ICD-10-CM

## 2022-05-28 DIAGNOSIS — E119 Type 2 diabetes mellitus without complications: Secondary | ICD-10-CM

## 2022-05-28 LAB — BASIC METABOLIC PANEL
Anion gap: 9 (ref 5–15)
BUN: 13 mg/dL (ref 8–23)
CO2: 31 mmol/L (ref 22–32)
Calcium: 9.1 mg/dL (ref 8.9–10.3)
Chloride: 99 mmol/L (ref 98–111)
Creatinine, Ser: 0.57 mg/dL (ref 0.44–1.00)
GFR, Estimated: >60 mL/min (ref >60)
Glucose, Bld: 110 mg/dL — ABNORMAL HIGH (ref 70–99)
Potassium: 3.2 mmol/L — ABNORMAL LOW (ref 3.5–5.1)
Sodium: 139 mmol/L (ref 135–145)

## 2022-05-28 LAB — GLUCOSE, CAPILLARY
Glucose-Capillary: 87 mg/dL (ref 70–99)
Glucose-Capillary: 109 mg/dL — ABNORMAL HIGH (ref 70–99)

## 2022-05-28 MED ORDER — GUAIFENESIN-DM 100-10 MG/5ML PO SYRP: 5.0000 mL | ORAL_SOLUTION | ORAL | 0 refills | Status: DC | PRN

## 2022-05-28 MED ORDER — POTASSIUM CHLORIDE CRYS ER 20 MEQ PO TBCR
30.0000 meq | EXTENDED_RELEASE_TABLET | ORAL | Status: AC
  Administered 2022-05-28: 30 meq via ORAL
  Filled 2022-05-28: qty 1

## 2022-05-28 MED ORDER — ATORVASTATIN CALCIUM 20 MG PO TABS: 20.0000 mg | ORAL_TABLET | Freq: Every day | ORAL | 1 refills | Status: DC

## 2022-05-28 MED ORDER — METFORMIN HCL ER 500 MG PO TB24: 500.0000 mg | ORAL_TABLET | Freq: Two times a day (BID) | ORAL | 1 refills | Status: DC

## 2022-05-28 MED ORDER — POTASSIUM CHLORIDE CRYS ER 20 MEQ PO TBCR: 20.0000 meq | EXTENDED_RELEASE_TABLET | Freq: Every day | ORAL | 2 refills | Status: DC

## 2022-05-28 MED ORDER — METOPROLOL SUCCINATE ER 25 MG PO TB24: 25.0000 mg | ORAL_TABLET | Freq: Every day | ORAL | 2 refills | Status: DC

## 2022-05-28 MED ORDER — TORSEMIDE 20 MG PO TABS: 20.0000 mg | ORAL_TABLET | Freq: Every day | ORAL | 1 refills | Status: DC

## 2022-05-28 MED ORDER — ZINC GLUCONATE 50 MG PO TABS: 50.0000 mg | ORAL_TABLET | Freq: Every day | ORAL | 0 refills | Status: AC

## 2022-05-28 MED ORDER — LOSARTAN POTASSIUM 25 MG PO TABS: 12.5000 mg | ORAL_TABLET | Freq: Every day | ORAL | 1 refills | Status: DC

## 2022-05-28 MED ORDER — DIGOXIN 125 MCG PO TABS: 0.1250 mg | ORAL_TABLET | Freq: Every day | ORAL | 1 refills | Status: DC

## 2022-05-28 NOTE — Discharge Summary (Signed)
Physician Discharge Summary   Patient: Pamela Kramer MRN: [id number] DOB: [birth year]  Admit date:     05/24/2022  Discharge date: 05/28/22  Discharge Physician: Arnetha Courser   PCP: Pcp, No   Recommendations at discharge:  Please obtain CBC and BMP within a week Follow-up with cardiology within a week Follow-up with primary care provider  Discharge Diagnoses: Principal Problem:   Influenza A with pneumonia Active Problems:   Sepsis (HCC)   Elevated troponin   Hypotension   Acute on chronic HFrEF (heart failure with reduced ejection fraction) (HCC)   Obesity, Class III, BMI 40-49.9 (morbid obesity) (HCC)   Diabetes mellitus (HCC)   Hypertension associated with diabetes (HCC)   SIRS (systemic inflammatory response syndrome) [facility])   Hospital Course: Taken from H&P.  Pamela Kramer is a 66 y.o. female with medical history significant for HTN, HFrEF,diabetes, class III obesity who presents to the ED with a several day history of flulike symptoms of cough, shortness of breath and fever.  She has become increasingly dyspneic with inability to walk from the bed to the doorway without becoming extremely short of breath.  Her husband is currently hospitalized in ICU with influenza. ED course and data review: Tmax 99.8 with pulse 113 and BP down to 97/70, respirations 25 with O2 sat 93% on room air.  Labs significant for WBC of 15,000 troponin 53, potassium 3.3.  Influenza A positive.EKG, personally viewed and interpreted showing sinus tachycardia at 109 with no acute ischemic ST-T wave changes.  Chest x-ray suggestive of pneumonia as further outlined below: IMPRESSION: 1. Right middle and possible right lower lobe airspace disease, suspicious for pneumonia. Followup PA and lateral chest X-ray is recommended in 3-4 weeks following trial of antibiotic therapy to ensure resolution and exclude underlying malignancy. 2. Cardiomegaly and chronic interstitial thickening. This could relate to  pulmonary venous congestion in this nonsmoker. 3. Decreased sensitivity and specificity exam due to technique related factors, as described above.  Patient was started on Tamiflu along with ceftriaxone and Zithromax for concern of superadded bacterial infection.  12/27: Vital stable, preliminary blood cultures negative in 12 hours, troponin 53>.61, BNP 825, CBC with some improvement of leukocytosis to 13.3, procalcitonin negative.  Clinically appears volume overload.  Prior echocardiogram done in 2020 with EF of 40 to 45%. DC IV fluid-ordered echocardiogram.  12/28: Later in the day yesterday patient did had worsening shortness of breath and hypoxia requiring up to 4 L of oxygen.  She was given 1 dose of IV Lasix. Vital stable this morning, saturating 100% on 4 L of oxygen.  Labs stable with resolution of leukocytosis. Echocardiogram with EF of 30 to 35%, dilated cardiomyopathy with global hypokinesis. Patient apparently stopped taking all of her home medications which includes carvedilol and Lasix Patient was unable to lay flat due to shortness of breath when seen today.  Chest with bilateral crackles, she was given another dose of Lasix. Patient will need more diuresis and to restart home medications due to worsening EF. Cardiology was also consulted.  12/29: Vitals and labs stable.  Cardiology started her on low-dose digoxin, losartan and metoprolol.  Patient need ischemic workup with right and left cardiac catheterization which was never done before.  Blood pressure later becomes little soft so morning dose of losartan and metoprolol was held.  Remained on 3 to 4 L of oxygen with no baseline oxygen use.  12/30: Hemodynamically stable.  Still needing 2 L of oxygen.  Patient will be ambulated with pulse ox  to determine the home oxygen requirement today.  Still no urinary output recorded, weight decreased to 276 from 206 which was recorded on 05/23/2022. Orthopnea and dyspnea seems improving.   BMP stable.  Will continue with IV diuresis for another day.  12/31: Patient remained hemodynamically stable.  No significant hypoxia even with ambulation.  Continue to have good urinary output per patient but no recorded UOP. She is being discharged on torsemide along with metoprolol, digoxin and low-dose losartan for heart failure.  Patient need to have a close follow-up with PCP and cardiology.  Patient will need ischemic workup with right and left cardiac catheterization as outpatient in the near future.  Discussed again with patient and she seems understanding.  Patient will continue with current medications and need to have a close follow-up with cardiology and primary care provider for further recommendations.  Assessment and Plan: * Influenza A with pneumonia Sepsis Acute dyspnea/impending acute respiratory failure Sepsis criteria include fever, tachycardia, tachypnea, hypotension leukocytosis and pneumonia.  Procalcitonin negative.  Preliminary blood cultures negative Tamiflu Discontinue Rocephin Continue azithromycin  Albuterol as needed, antitussives and supportive care Continue with supplemental oxygen-wean as tolerated  Sepsis (HCC) -See above  Elevated troponin Suspect secondary to demand ischemia from respiratory distress Patient reports mild left-sided chest pressure with EKG unremarkable.  She does have risk factors Continue to trend troponin Echocardiogram-with worsening of EF, dilated cardiomyopathy and global hypokinesis.  Hypotension Pressure within goal now.  Initially softer blood pressure most likely secondary to sepsis. -He was started on low-dose losartan and metoprolol by cardiology  Acute on chronic HFrEF (heart failure with reduced ejection fraction) (HCC) Patient with EF of 40 to 45% on echo done in 2020.  Significant lower extremity edema.  Did receive IV fluid due to sepsis.  Elevated BNP Repeat echocardiogram with worsening of EF to 30 to 35%,  dilated cardiomyopathy and global hypokinesis.  She did received a dose of Lasix.  Endorses orthopnea this morning.  Patient has stopped taking her home medications for about a year. -Start her on daily IV Lasix 40 mg-will continue for another day -Cardiology added low-dose digoxin, losartan and metoprolol -Monitor volume status closely -Daily weight and BMP -Strict intake and output -Patient will need ischemic workup with right and left cardiac catheterization which can be done as outpatient per cardiology  Hypertension associated with diabetes [facility]) Cardiology started low-dose metoprolol and losartan   Diabetes mellitus (HCC) CBG within goal. -Sliding scale insulin  Obesity, Class III, BMI 40-49.9 (morbid obesity) (HCC) Estimated body mass index is 49.39 kg/m as calculated from the following:   Height as of this encounter: 5\' 6"  (1.676 m).   Weight as of this encounter: 138.8 kg.   Complicating factor to overall prognosis and care   Consultants: Cardiology Procedures performed: None Disposition: Home Diet recommendation:  Discharge Diet Orders (From admission, onward)     Start     Ordered   05/28/22 0000  Diet - low sodium heart healthy        05/28/22 1201           Cardiac and Carb modified diet DISCHARGE MEDICATION: Allergies as of 05/28/2022       Reactions   Excedrin Migraine  [aspirin-acetaminophen-caffeine] Anaphylaxis   Lisinopril Cough   Entresto [sacubitril-valsartan] Nausea Only, Other (See Comments), Cough   insomnia        Medication List     STOP taking these medications    carvedilol 6.25 MG tablet Commonly known as: COREG  furosemide 40 MG tablet Commonly known as: Lasix   ipratropium-albuterol 0.5-2.5 (3) MG/3ML Soln Commonly known as: DUONEB   omeprazole 20 MG capsule Commonly known as: PRILOSEC   potassium chloride 10 MEQ tablet Commonly known as: KLOR-CON   topiramate 50 MG tablet Commonly known as: TOPAMAX   vitamin C  with rose hips 1000 MG tablet       TAKE these medications    atorvastatin 20 MG tablet Commonly known as: LIPITOR Take 1 tablet (20 mg total) by mouth daily.   digoxin 0.125 MG tablet Commonly known as: LANOXIN Take 1 tablet (0.125 mg total) by mouth daily.   guaiFENesin-dextromethorphan 100-10 MG/5ML syrup Commonly known as: ROBITUSSIN DM Take 5 mLs by mouth every 4 (four) hours as needed for cough.   losartan 25 MG tablet Commonly known as: COZAAR Take 0.5 tablets (12.5 mg total) by mouth daily.   metFORMIN 500 MG 24 hr tablet Commonly known as: GLUCOPHAGE-XR Take 1 tablet (500 mg total) by mouth 2 times daily at 12 noon and 4 pm.   metoprolol succinate 25 MG 24 hr tablet Commonly known as: TOPROL-XL Take 1 tablet (25 mg total) by mouth daily.   potassium chloride SA 20 MEQ tablet Commonly known as: KLOR-CON M Take 1 tablet (20 mEq total) by mouth daily.   torsemide 20 MG tablet Commonly known as: DEMADEX Take 1 tablet (20 mg total) by mouth daily.   Vitamin D3 10 MCG (400 UNIT) tablet Take 2,000 Units by mouth daily.   zinc gluconate 50 MG tablet Take 1 tablet (50 mg total) by mouth daily.        Follow-up Information     Iran Ouch, MD. Schedule an appointment as soon as possible for a visit in 1 week(s).   Specialty: Cardiology Contact information: [address] [place] [address] [phone]                Discharge Exam: Ceasar Mons Weights   05/23/22 1728 05/27/22 0500  Weight: (!) 138.8 kg 125.5 kg   General.  Morbidly obese lady, in no acute distress. Pulmonary.  Lungs clear bilaterally, normal respiratory effort. CV.  Regular rate and rhythm, no JVD, rub or murmur. Abdomen.  Soft, nontender, nondistended, BS positive. CNS.  Alert and oriented .  No focal neurologic deficit. Extremities.  Trace LE edema, no cyanosis, pulses intact and symmetrical. Psychiatry.  Judgment and insight appears normal.    Condition at discharge: stable  The results of significant diagnostics from this hospitalization (including imaging, microbiology, ancillary and laboratory) are listed below for reference.   Imaging Studies: ECHOCARDIOGRAM COMPLETE  Result Date: 05/25/2022    ECHOCARDIOGRAM REPORT   Patient Name:   RASHAUNA TEP Date of Exam: 05/24/2022 Medical Rec #:  [id number]      Height:       66.0 in Accession #:    [id number]     Weight:       306.0 lb Date of Birth:  [birth year]      BSA:          2.396 m Patient Age:    66 years       BP:           117/49 mmHg Patient Gender: F              HR:           107 bpm. Exam Location:  [facility] Procedure: 2D Echo, Cardiac Doppler and Color Doppler  Indications:     Dyspnea R06.00  History:         Patient has prior history of Echocardiogram examinations, most                  recent 03/13/2019. CHF; Risk Factors:Dyslipidemia and                  Hypertension.  Sonographer:     Cristela Blue Referring Phys:  [id number] [facility] Armarion Greek Diagnosing Phys: Alwyn Pea MD  Sonographer Comments: Technically difficult study due to poor echo windows, no apical window and no subcostal window. IMPRESSIONS  1. Technically difficult study.  2. Left ventricular ejection fraction, by estimation, is 30 to 35%. The left ventricle has moderately decreased function. The left ventricle demonstrates global hypokinesis. The left ventricular internal cavity size was moderately dilated. There is mild  left ventricular hypertrophy. Left ventricular diastolic function could not be evaluated.  3. Right ventricular systolic function is mildly reduced. The right ventricular size is mildly enlarged. Mildly increased right ventricular wall thickness.  4. Left atrial size was severely dilated.  5. Right atrial size was mildly dilated.  6. The mitral valve was not well visualized. No evidence of mitral valve regurgitation.  7. The aortic valve was not well visualized. Aortic valve regurgitation is not  visualized. Conclusion(s)/Recommendation(s): Poor windows for evaluation of left ventricular function by transthoracic echocardiography. Would recommend an alternative means of evaluation. FINDINGS  Left Ventricle: Left ventricular ejection fraction, by estimation, is 30 to 35%. The left ventricle has moderately decreased function. The left ventricle demonstrates global hypokinesis. The left ventricular internal cavity size was moderately dilated. There is mild left ventricular hypertrophy. Left ventricular diastolic function could not be evaluated. Right Ventricle: The right ventricular size is mildly enlarged. Mildly increased right ventricular wall thickness. Right ventricular systolic function is mildly reduced. Left Atrium: Left atrial size was severely dilated. Right Atrium: Right atrial size was mildly dilated. Pericardium: The pericardium was not well visualized. Mitral Valve: The mitral valve was not well visualized. No evidence of mitral valve regurgitation. Tricuspid Valve: The tricuspid valve is not well visualized. Tricuspid valve regurgitation is mild. Aortic Valve: The aortic valve was not well visualized. Aortic valve regurgitation is not visualized. Pulmonic Valve: The pulmonic valve was not well visualized. Pulmonic valve regurgitation is not visualized. Aorta: The ascending aorta was not well visualized. IAS/Shunts: No atrial level shunt detected by color flow Doppler. Additional Comments: Technically difficult study.  LEFT VENTRICLE PLAX 2D LVIDd:         5.60 cm LVIDs:         5.30 cm LV PW:         1.50 cm LV IVS:        0.90 cm  LEFT ATRIUM         Index LA diam:    5.20 cm 2.17 cm/m   AORTA Ao Root diam: 3.60 cm Alwyn Pea MD Electronically signed by Alwyn Pea MD Signature Date/Time: 05/25/2022/8:27:22 AM    Final    DG Chest 2 View  Result Date: 05/23/2022 CLINICAL DATA:  Cough.  Shortness of breath.  Fever. EXAM: CHEST - 2 VIEW COMPARISON:  03/15/2019 FINDINGS: Both  images are limited by patient body habitus and arm position on the lateral view. Midline trachea. Moderate cardiomegaly. No pleural effusion or pneumothorax. Diffuse interstitial thickening is similar. Right middle and possible right lower lobe airspace disease. IMPRESSION: 1. Right middle and possible right lower lobe  airspace disease, suspicious for pneumonia. Followup PA and lateral chest X-ray is recommended in 3-4 weeks following trial of antibiotic therapy to ensure resolution and exclude underlying malignancy. 2. Cardiomegaly and chronic interstitial thickening. This could relate to pulmonary venous congestion in this nonsmoker. 3. Decreased sensitivity and specificity exam due to technique related factors, as described above. Electronically Signed   By: Jeronimo GreavesKyle  Talbot M.D.   On: 05/23/2022 17:57    Microbiology: Results for orders placed or performed during the hospital encounter of 05/24/22  Resp panel by RT-PCR (RSV, Flu A&B, Covid) Anterior Nasal Swab     Status: Abnormal   Collection Time: 05/23/22  5:32 PM   Specimen: Anterior Nasal Swab  Result Value Ref Range Status   SARS Coronavirus 2 by RT PCR NEGATIVE NEGATIVE Final    Comment: (NOTE) SARS-CoV-2 target nucleic acids are NOT DETECTED.  The SARS-CoV-2 RNA is generally detectable in upper respiratory specimens during the acute phase of infection. The lowest concentration of SARS-CoV-2 viral copies this assay can detect is 138 copies/mL. A negative result does not preclude SARS-Cov-2 infection and should not be used as the sole basis for treatment or other patient management decisions. A negative result may occur with  improper specimen collection/handling, submission of specimen other than nasopharyngeal swab, presence of viral mutation(s) within the areas targeted by this assay, and inadequate number of viral copies(<138 copies/mL). A negative result must be combined with clinical observations, patient history, and  epidemiological information. The expected result is Negative.  Fact Sheet for Patients:  BloggerCourse.comhttps://www.fda.gov/media/152166/download  Fact Sheet for Healthcare Providers:  SeriousBroker.ithttps://www.fda.gov/media/152162/download  This test is no t yet approved or cleared by the [place] FDA and  has been authorized for detection and/or diagnosis of SARS-CoV-2 by FDA under an Emergency Use Authorization (EUA). This EUA will remain  in effect (meaning this test can be used) for the duration of the COVID-19 declaration under Section 564(b)(1) of the Act, 21 U.S.C.section 360bbb-3(b)(1), unless the authorization is terminated  or revoked sooner.       Influenza A by PCR POSITIVE (A) NEGATIVE Final   Influenza B by PCR NEGATIVE NEGATIVE Final    Comment: (NOTE) The Xpert Xpress SARS-CoV-2/FLU/RSV plus assay is intended as an aid in the diagnosis of influenza from Nasopharyngeal swab specimens and should not be used as a sole basis for treatment. Nasal washings and aspirates are unacceptable for Xpert Xpress SARS-CoV-2/FLU/RSV testing.  Fact Sheet for Patients: BloggerCourse.comhttps://www.fda.gov/media/152166/download  Fact Sheet for Healthcare Providers: SeriousBroker.ithttps://www.fda.gov/media/152162/download  This test is not yet approved or cleared by the [place] FDA and has been authorized for detection and/or diagnosis of SARS-CoV-2 by FDA under an Emergency Use Authorization (EUA). This EUA will remain in effect (meaning this test can be used) for the duration of the COVID-19 declaration under Section 564(b)(1) of the Act, 21 U.S.C. section 360bbb-3(b)(1), unless the authorization is terminated or revoked.     Resp Syncytial Virus by PCR NEGATIVE NEGATIVE Final    Comment: (NOTE) Fact Sheet for Patients: BloggerCourse.comhttps://www.fda.gov/media/152166/download  Fact Sheet for Healthcare Providers: SeriousBroker.ithttps://www.fda.gov/media/152162/download  This test is not yet approved or cleared by the [place] FDA and has  been authorized for detection and/or diagnosis of SARS-CoV-2 by FDA under an Emergency Use Authorization (EUA). This EUA will remain in effect (meaning this test can be used) for the duration of the COVID-19 declaration under Section 564(b)(1) of the Act, 21 U.S.C. section 360bbb-3(b)(1), unless the authorization is terminated or revoked.  Performed at [facility]  Lab, [address] [place], [place] [address]   Blood culture (routine x 2)     Status: None (Preliminary result)   Collection Time: 05/24/22  3:09 AM   Specimen: BLOOD  Result Value Ref Range Status   Specimen Description BLOOD BLOOD RIGHT HAND  Final   Special Requests   Final    BOTTLES DRAWN AEROBIC AND ANAEROBIC Blood Culture results may not be optimal due to an inadequate volume of blood received in culture bottles   Culture   Final    NO GROWTH 4 DAYS Performed at [facility], [address] [place], [place] [address]    Report Status PENDING  Incomplete  Blood culture (routine x 2)     Status: None (Preliminary result)   Collection Time: 05/24/22  3:09 AM   Specimen: BLOOD  Result Value Ref Range Status   Specimen Description BLOOD BLOOD LEFT HAND  Final   Special Requests   Final    BOTTLES DRAWN AEROBIC AND ANAEROBIC Blood Culture adequate volume   Culture   Final    NO GROWTH 4 DAYS Performed at [facility], [address] [place], [place] [address]    Report Status PENDING  Incomplete    Labs: CBC: Recent Labs  Lab 05/23/22 1731 05/24/22 0432 05/25/22 0927  WBC 15.0* 13.3* 9.4  NEUTROABS 13.4*  --   --   HGB 14.7 14.2 12.9  HCT 45.1 44.3 42.2  MCV 85.7 87.0 90.0  PLT 229 240 235   Basic Metabolic Panel: Recent Labs  Lab 05/23/22 1731 05/24/22 0432 05/25/22 0927 05/26/22 0544 05/27/22 1059 05/28/22 0508  NA 137  --  138 138 138 139  K 3.3*  --  3.5 3.7 3.5 3.2*  CL 102  --  100 99 99 99  CO2 24  --  GLUCOSE 132*  --  131* 120* 158* 110*   BUN 8  --  CREATININE 0.68 0.70 0.76 0.75 0.65 0.57  CALCIUM 8.9  --  8.6* 8.8* 9.1 9.1   Liver Function Tests: No results for input(s): "AST", "ALT", "ALKPHOS", "BILITOT", "PROT", "ALBUMIN" in the last 168 hours. CBG: Recent Labs  Lab 05/27/22 1146 05/27/22 1655 05/27/22 2137 05/28/22 0858 05/28/22 1153  GLUCAP 139* 117* 128* 87 109*    Discharge time spent: greater than 30 minutes.  This record has been created using Conservation officer, historic buildings. Errors have been sought and corrected,but may not always be located. Such creation errors do not reflect on the standard of care.   Signed: Arnetha Courser, MD [facility] 05/28/2022

## 2022-05-29 LAB — CULTURE, BLOOD (ROUTINE X 2)
Culture: NO GROWTH
Culture: NO GROWTH
Special Requests: ADEQUATE

## 2022-06-15 ENCOUNTER — Encounter: Payer: Self-pay | Admitting: *Deleted

## 2022-06-16 ENCOUNTER — Encounter: Payer: Self-pay | Admitting: Cardiovascular Disease

## 2022-06-16 ENCOUNTER — Other Ambulatory Visit
Admission: RE | Admit: 2022-06-16 | Discharge: 2022-06-16 | Disposition: A | Discharge status: Home / Self Care | Payer: Self-pay | Source: Ambulatory Visit | Attending: Cardiovascular Disease | Admitting: Cardiovascular Disease

## 2022-06-16 ENCOUNTER — Ambulatory Visit: Payer: Self-pay | Attending: Physician Assistant | Admitting: Cardiovascular Disease

## 2022-06-16 VITALS — BP 100/60 | HR 95 | Ht 65.0 in | Wt 274.5 lb

## 2022-06-16 DIAGNOSIS — I5022 Chronic systolic (congestive) heart failure: Secondary | ICD-10-CM | POA: Insufficient documentation

## 2022-06-16 DIAGNOSIS — E785 Hyperlipidemia, unspecified: Secondary | ICD-10-CM

## 2022-06-16 DIAGNOSIS — R7989 Other specified abnormal findings of blood chemistry: Secondary | ICD-10-CM

## 2022-06-16 LAB — BASIC METABOLIC PANEL
Anion gap: 10 (ref 5–15)
BUN: 17 mg/dL (ref 8–23)
CO2: 25 mmol/L (ref 22–32)
Calcium: 9.2 mg/dL (ref 8.9–10.3)
Chloride: 105 mmol/L (ref 98–111)
Creatinine, Ser: 0.71 mg/dL (ref 0.44–1.00)
GFR, Estimated: >60 mL/min (ref >60)
Glucose, Bld: 94 mg/dL (ref 70–99)
Potassium: 4.2 mmol/L (ref 3.5–5.1)
Sodium: 140 mmol/L (ref 135–145)

## 2022-06-16 LAB — DIGOXIN LEVEL: Digoxin Level: 0.7 ng/mL — ABNORMAL LOW (ref 0.8–2.0)

## 2022-06-16 NOTE — Patient Instructions (Signed)
Medication Instructions:  No changes *If you need a refill on your cardiac medications before your next appointment, please call your pharmacy*   Lab Work: Your provider would like for you to have following labs drawn: BMET and Digoxin.   Please go to the [facility] entrance and check in at the front desk.  You do not need an appointment.  They are open from 7am-6 pm.   If you have labs (blood work) drawn today and your tests are completely normal, you will receive your results only by: [place] (if you have MyChart) OR A paper copy in the mail If you have any lab test that is abnormal or we need to change your treatment, we will call you to review the results.   Testing/Procedures: None ordered   Follow-Up: At [facility], you and your health needs are our priority.  As part of our continuing mission to provide you with exceptional heart care, we have created designated Provider Care Teams.  These Care Teams include your primary Cardiologist (physician) and Advanced Practice Providers (APPs -  Physician Assistants and Nurse Practitioners) who all work together to provide you with the care you need, when you need it.  We recommend signing up for the patient portal called "MyChart".  Sign up information is provided on this After Visit Summary.  MyChart is used to connect with patients for Virtual Visits (Telemedicine).  Patients are able to view lab/test results, encounter notes, upcoming appointments, etc.  Non-urgent messages can be sent to your provider as well.   To learn more about what you can do with MyChart, go to [email or web address].    Your next appointment:   1 month(s)  Provider:   You may see Dr. Fletcher Anon or one of the following Advanced Practice Providers on your designated Care Team:   Murray Hodgkins, NP Christell Faith, PA-C Cadence Kathlen Mody, PA-C Gerrie Nordmann, NP

## 2022-06-16 NOTE — Progress Notes (Signed)
Cardiology Office Note   Date:  06/16/2022   ID:  Mell Mellott, DOB [birth year], MRN [id number]  PCP:  Sueanne Margarita, DO  Cardiologist:   Kathlyn Sacramento, MD   Chief Complaint  Patient presents with   Other    F/u hosp. CHF/Elevated Troponin c/o headaches, back tightness/ribs and joint pains. Meds reviewed verbally with pt.      History of Present Illness: Shanessa Hodak is a 67 y.o. female who presents for a follow-up visit regarding chronic systolic heart failure. She has known history of type 2 diabetes, obesity and essential hypertension. She was seen by Dr. Clayborn Bigness in 2020 for newly diagnosed heart failure with an EF of 35 to 40%.  A nuclear stress test was done and showed no clear perfusion defects and she never had any cardiac catheterization done.  She was frustrated due to multiple side effects of heart failure medications and wanted to follow a holistic approach.  Thus, she did not keep up with her cardiology appointment. She was hospitalized recently with the flu and she was noted to be tachycardic.  Troponin was mildly weighted.  She had an echocardiogram done which showed an EF of 30 to 35% with global hypokinesis.  She was mildly volume overloaded and she improved with diuresis.  I started her on small dose Toprol, losartan and digoxin. She reports significant improvement in symptoms since hospital discharge with significant weight loss of close to 35 pounds.  She reports some joint discomfort and increased headache but overall has been tolerating her medications well.   Past Medical History:  Diagnosis Date   CHF (congestive heart failure) (HCC)    Hyperlipidemia    Hypertension    Pre-diabetes     Past Surgical History:  Procedure Laterality Date   TUBAL LIGATION       Current Outpatient Medications  Medication Sig Dispense Refill   Acetaminophen (TYLENOL PO) Take by mouth daily as needed.     atorvastatin (LIPITOR) 20 MG tablet Take 1 tablet (20 mg  total) by mouth daily. 90 tablet 1   Cholecalciferol (VITAMIN D3) 10 MCG (400 UNIT) tablet Take 2,000 Units by mouth daily.     digoxin (LANOXIN) 0.125 MG tablet Take 1 tablet (0.125 mg total) by mouth daily. 30 tablet 1   losartan (COZAAR) 25 MG tablet Take 0.5 tablets (12.5 mg total) by mouth daily. 30 tablet 1   metFORMIN (GLUCOPHAGE-XR) 500 MG 24 hr tablet Take 1 tablet (500 mg total) by mouth 2 times daily at 12 noon and 4 pm. 180 tablet 1   metoprolol succinate (TOPROL-XL) 25 MG 24 hr tablet Take 1 tablet (25 mg total) by mouth daily. 30 tablet 2   potassium chloride SA (KLOR-CON M) 20 MEQ tablet Take 1 tablet (20 mEq total) by mouth daily. 30 tablet 2   torsemide (DEMADEX) 20 MG tablet Take 1 tablet (20 mg total) by mouth daily. 90 tablet 1   zinc gluconate 50 MG tablet Take 1 tablet (50 mg total) by mouth daily. 90 tablet 0   No current facility-administered medications for this visit.    Allergies:   Excedrin migraine  [aspirin-acetaminophen-caffeine], Lisinopril, and Entresto [sacubitril-valsartan]    Social History:  The patient  reports that she has never smoked. She has never used smokeless tobacco. She reports current alcohol use of about 1.0 standard drink of alcohol per week. She reports that she does not use drugs.   Family History:  The patient's family history is not  on file.    ROS:  Please see the history of present illness.   Otherwise, review of systems are positive for none.   All other systems are reviewed and negative.    PHYSICAL EXAM: VS:  BP 100/60 (BP Location: Left Arm, Patient Position: Sitting, Cuff Size: Large)   Pulse 95   Ht 5\' 5"  (1.651 m)   Wt 274 lb 8 oz (124.5 kg)   SpO2 98%   BMI 45.68 kg/m  , BMI Body mass index is 45.68 kg/m. GEN: Well nourished, well developed, in no acute distress  HEENT: normal  Neck: no JVD, carotid bruits, or masses Cardiac: RRR; no murmurs, rubs, or gallops,no edema  Respiratory:  clear to auscultation bilaterally,  normal work of breathing GI: soft, nontender, nondistended, + BS MS: no deformity or atrophy  Skin: warm and dry, no rash Neuro:  Strength and sensation are intact Psych: euthymic mood, full affect   EKG:  EKG is ordered today. The ekg ordered today demonstrates sinus rhythm with PVCs and low voltage.   Recent Labs: 05/24/2022: B Natriuretic Peptide 825.3 05/25/2022: Hemoglobin 12.9; Platelets 235 05/28/2022: BUN 13; Creatinine, Ser 0.57; Potassium 3.2; Sodium 139    Lipid Panel No results found for: "CHOL", "TRIG", "HDL", "CHOLHDL", "VLDL", "LDLCALC", "LDLDIRECT"    Wt Readings from Last 3 Encounters:  06/16/22 274 lb 8 oz (124.5 kg)  05/27/22 276 lb 10.8 oz (125.5 kg)  04/06/21 (!) 306 lb 3 oz (138.9 kg)          No data to display            ASSESSMENT AND PLAN:  1.  Chronic systolic heart failure: Significant improvement in symptoms after placing her on heart failure medications and diuresis.  Blood pressure is low and does not allow up titration.  I requested basic metabolic profile and digoxin level. She did not tolerate Entresto in the past due to reported nausea and low blood pressure.  Will hold off for now.  Will consider adding spironolactone in the near future and likely an SGLT2 inhibitor.  However, will keep in mind if history of poor tolerance to medications. I discussed with her the indication to proceed a right and left cardiac catheterization to evaluate her cardiomyopathy.  She wants to hold off for now as she is very scared of the procedure and wants to wait at least another month.  2.  Mildly elevated troponin: Likely due to supply demand ischemia in the setting of acute on chronic heart failure and sinus tachycardia.  However, she has multiple risk factors for coronary artery disease and we have to exclude ischemic cardiomyopathy.  3.  Obesity: Significant weight loss with diuresis.  4.  Hyperlipidemia: Currently on atorvastatin 20 mg  daily.      Disposition:   FU in 1 month. Signed,  Kathlyn Sacramento, MD  06/16/2022 12:22 PM    [facility]

## 2022-07-21 ENCOUNTER — Encounter: Payer: Self-pay | Admitting: Cardiovascular Disease

## 2022-07-21 ENCOUNTER — Ambulatory Visit: Payer: Medicare Other | Attending: Cardiovascular Disease | Admitting: Cardiovascular Disease

## 2022-07-21 VITALS — BP 110/60 | HR 71 | Ht 66.0 in | Wt 277.2 lb

## 2022-07-21 DIAGNOSIS — I5022 Chronic systolic (congestive) heart failure: Secondary | ICD-10-CM | POA: Diagnosis not present

## 2022-07-21 DIAGNOSIS — E785 Hyperlipidemia, unspecified: Secondary | ICD-10-CM | POA: Diagnosis not present

## 2022-07-21 DIAGNOSIS — R7989 Other specified abnormal findings of blood chemistry: Secondary | ICD-10-CM

## 2022-07-21 MED ORDER — SPIRONOLACTONE 25 MG PO TABS: 12.5000 mg | ORAL_TABLET | Freq: Every day | ORAL | 1 refills | Status: DC

## 2022-07-21 NOTE — Progress Notes (Signed)
Cardiology Office Note   Date:  07/21/2022   ID:  Pamela Kramer, DOB 1955/11/30, MRN [id number]  PCP:  Sueanne Margarita, DO  Cardiologist:   Kathlyn Sacramento, MD   Chief Complaint  Patient presents with   OTHER    1 month f/u no complaints today. Meds reviewed verbally with pt.      History of Present Illness: Pamela Kramer is a 67 y.o. female who presents for a follow-up visit regarding chronic systolic heart failure. She has known history of type 2 diabetes, obesity and essential hypertension. She was seen by Dr. Clayborn Bigness in 2020 for newly diagnosed heart failure with an EF of 35 to 40%.  A nuclear stress test was done and showed no clear perfusion defects and she never had any cardiac catheterization done.  She was frustrated due to multiple side effects of heart failure medications and wanted to follow a holistic approach.  Thus, she did not keep up with her cardiology appointment. She was hospitalized recently with the flu and she was noted to be tachycardic.  Troponin was mildly weighted.  She had an echocardiogram done which showed an EF of 30 to 35% with global hypokinesis.  She was mildly volume overloaded and she improved with diuresis.  I started her on small dose Toprol, losartan and digoxin. She lost 35 pounds with diuresis.  Her weight has now stabilized and she has been doing well with no chest pain.  She reports significant improvement in shortness of breath with resolution of orthopnea.  No lower extremity edema.   Past Medical History:  Diagnosis Date   CHF (congestive heart failure) (HCC)    Hyperlipidemia    Hypertension    Pre-diabetes     Past Surgical History:  Procedure Laterality Date   TUBAL LIGATION       Current Outpatient Medications  Medication Sig Dispense Refill   Acetaminophen (TYLENOL PO) Take by mouth daily as needed.     atorvastatin (LIPITOR) 20 MG tablet Take 1 tablet (20 mg total) by mouth daily. 90 tablet 1   Cholecalciferol (VITAMIN  D3) 10 MCG (400 UNIT) tablet Take 2,000 Units by mouth daily.     digoxin (LANOXIN) 0.125 MG tablet Take 1 tablet (0.125 mg total) by mouth daily. 30 tablet 1   losartan (COZAAR) 25 MG tablet Take 0.5 tablets (12.5 mg total) by mouth daily. 30 tablet 1   metFORMIN (GLUCOPHAGE-XR) 500 MG 24 hr tablet Take 1 tablet (500 mg total) by mouth 2 times daily at 12 noon and 4 pm. 180 tablet 1   metoprolol succinate (TOPROL-XL) 25 MG 24 hr tablet Take 1 tablet (25 mg total) by mouth daily. 30 tablet 2   torsemide (DEMADEX) 20 MG tablet Take 1 tablet (20 mg total) by mouth daily. 90 tablet 1   zinc gluconate 50 MG tablet Take 1 tablet (50 mg total) by mouth daily. 90 tablet 0   No current facility-administered medications for this visit.    Allergies:   Excedrin migraine  [aspirin-acetaminophen-caffeine], Lisinopril, and Entresto [sacubitril-valsartan]    Social History:  The patient  reports that she has never smoked. She has never used smokeless tobacco. She reports current alcohol use of about 1.0 standard drink of alcohol per week. She reports that she does not use drugs.   Family History:  The patient's family history is not on file.    ROS:  Please see the history of present illness.   Otherwise, review of systems are positive  for none.   All other systems are reviewed and negative.    PHYSICAL EXAM: VS:  BP 110/60 (BP Location: Left Arm, Patient Position: Sitting, Cuff Size: Large)   Pulse 71   Ht '5\' 6"'$  (1.676 m)   Wt 277 lb 4 oz (125.8 kg)   SpO2 95%   BMI 44.75 kg/m  , BMI Body mass index is 44.75 kg/m. GEN: Well nourished, well developed, in no acute distress  HEENT: normal  Neck: no JVD, carotid bruits, or masses Cardiac: RRR; no murmurs, rubs, or gallops,no edema  Respiratory:  clear to auscultation bilaterally, normal work of breathing GI: soft, nontender, nondistended, + BS MS: no deformity or atrophy  Skin: warm and dry, no rash Neuro:  Strength and sensation are  intact Psych: euthymic mood, full affect   EKG:  EKG is not ordered today.   Recent Labs: 05/24/2022: B Natriuretic Peptide 825.3 05/25/2022: Hemoglobin 12.9; Platelets 235 06/16/2022: BUN 17; Creatinine, Ser 0.71; Potassium 4.2; Sodium 140    Lipid Panel No results found for: "CHOL", "TRIG", "HDL", "CHOLHDL", "VLDL", "LDLCALC", "LDLDIRECT"    Wt Readings from Last 3 Encounters:  07/21/22 277 lb 4 oz (125.8 kg)  06/16/22 274 lb 8 oz (124.5 kg)  05/27/22 276 lb 10.8 oz (125.5 kg)          No data to display            ASSESSMENT AND PLAN:  1.  Chronic systolic heart failure: Her symptoms improved and she is currently [place] heart association class II.  She appears to be euvolemic on current dose of torsemide 20 mg once daily.   She did not tolerate Entresto in the past due to reported nausea and low blood pressure.  Will hold off for now.   I elected to add spironolactone 12.5 mg once daily and and stop potassium chloride.  Check basic metabolic profile in 1 week. Will consider adding an SGLT2 inhibitor as a neck step. I again discussed with her proceeding with a right and left cardiac catheterization but her husband is sick and needs surgery and she wants to hold off for now.  2.  Mildly elevated troponin: Likely due to supply demand ischemia in the setting of acute on chronic heart failure and sinus tachycardia.  However, she has multiple risk factors for coronary artery disease and we have to exclude ischemic cardiomyopathy.  3.  Obesity: Her weight has stabilized and she wants to lose more weight.  4.  Hyperlipidemia: Currently on atorvastatin 20 mg daily.    Disposition:   FU in 3 months. Signed,  Kathlyn Sacramento, MD  07/21/2022 11:00 AM    [place]

## 2022-07-21 NOTE — Patient Instructions (Signed)
Medication Instructions:  START Spironolactone 12.5 mg once daily (half a tablet)  STOP the Potassium  *If you need a refill on your cardiac medications before your next appointment, please call your pharmacy*   Lab Work: Your provider would like for you to return in one week to have the following labs drawn: BMET.   Please go to the [facility] entrance and check in at the front desk.  You do not need an appointment.  They are open from 7am-6 pm.  You will not need to be fasting.  If you have labs (blood work) drawn today and your tests are completely normal, you will receive your results only by: [place] (if you have MyChart) OR A paper copy in the mail If you have any lab test that is abnormal or we need to change your treatment, we will call you to review the results.   Testing/Procedures: None ordered   Follow-Up: At [facility], you and your health needs are our priority.  As part of our continuing mission to provide you with exceptional heart care, we have created designated Provider Care Teams.  These Care Teams include your primary Cardiologist (physician) and Advanced Practice Providers (APPs -  Physician Assistants and Nurse Practitioners) who all work together to provide you with the care you need, when you need it.  We recommend signing up for the patient portal called "MyChart".  Sign up information is provided on this After Visit Summary.  MyChart is used to connect with patients for Virtual Visits (Telemedicine).  Patients are able to view lab/test results, encounter notes, upcoming appointments, etc.  Non-urgent messages can be sent to your provider as well.   To learn more about what you can do with MyChart, go to [email or web address].    Your next appointment:   3 month(s)  Provider:   You may see Kathlyn Sacramento, MD or one of the following Advanced Practice Providers on your designated Care Team:   Murray Hodgkins, NP Christell Faith,  PA-C Cadence Kathlen Mody, PA-C Gerrie Nordmann, NP

## 2022-08-01 ENCOUNTER — Other Ambulatory Visit
Admission: RE | Admit: 2022-08-01 | Discharge: 2022-08-01 | Disposition: A | Discharge status: Home / Self Care | Payer: Medicare Other | Attending: Cardiovascular Disease | Admitting: Cardiovascular Disease

## 2022-08-01 DIAGNOSIS — I5022 Chronic systolic (congestive) heart failure: Secondary | ICD-10-CM | POA: Diagnosis present

## 2022-08-01 LAB — BASIC METABOLIC PANEL
Anion gap: 9 (ref 5–15)
BUN: 12 mg/dL (ref 8–23)
CO2: 26 mmol/L (ref 22–32)
Calcium: 9.1 mg/dL (ref 8.9–10.3)
Chloride: 104 mmol/L (ref 98–111)
Creatinine, Ser: 0.72 mg/dL (ref 0.44–1.00)
GFR, Estimated: >60 mL/min (ref >60)
Glucose, Bld: 102 mg/dL — ABNORMAL HIGH (ref 70–99)
Potassium: 4 mmol/L (ref 3.5–5.1)
Sodium: 139 mmol/L (ref 135–145)

## 2022-10-18 NOTE — Progress Notes (Unsigned)
Cardiology Office Note   Date:  10/19/2022   ID:  Pamela Kramer, DOB 1955-10-23, MRN [id number]  PCP:  Charlane Ferretti, DO  Cardiologist:   Lorine Bears, MD   Chief Complaint  Patient presents with   Follow-up    3 month f/u pt has been out of Digoxin for months/Metoprolol for weeks. Meds reviewed verbally with pt.      History of Present Illness: Pamela Kramer is a 67 y.o. female who presents for a follow-up visit regarding chronic systolic heart failure. She has known history of type 2 diabetes, obesity and essential hypertension. She was seen by Dr. Juliann Pares in 2020 for newly diagnosed heart failure with an EF of 35 to 40%.  A nuclear stress test was done and showed no clear perfusion defects and she never had any cardiac catheterization done.  She was frustrated due to multiple side effects of heart failure medications and wanted to follow a holistic approach.  Thus, she did not keep up with her cardiology appointment. She was hospitalized recently with the flu and she was noted to be tachycardic.  Troponin was mildly weighted.  She had an echocardiogram done which showed an EF of 30 to 35% with global hypokinesis.  She was mildly volume overloaded and she improved with diuresis.  I started her on small dose Toprol, losartan and digoxin. She lost 35 pounds with diuresis.   She ran out of Toprol and digoxin few weeks ago.  She has been doing well with no chest pain or shortness of breath.  Her weight remains stable.     Past Medical History:  Diagnosis Date   CHF (congestive heart failure) (HCC)    Hyperlipidemia    Hypertension    Pre-diabetes     Past Surgical History:  Procedure Laterality Date   TUBAL LIGATION       Current Outpatient Medications  Medication Sig Dispense Refill   Acetaminophen (TYLENOL PO) Take by mouth daily as needed.     atorvastatin (LIPITOR) 20 MG tablet Take 1 tablet (20 mg total) by mouth daily. 90 tablet 1   Cholecalciferol (VITAMIN  D3) 10 MCG (400 UNIT) tablet Take 2,000 Units by mouth daily.     Semaglutide, 1 MG/DOSE, (OZEMPIC, 1 MG/DOSE,) 4 MG/3ML SOPN Inject 1 mg into the skin once a week.     spironolactone (ALDACTONE) 25 MG tablet Take 0.5 tablets (12.5 mg total) by mouth daily. 45 tablet 1   torsemide (DEMADEX) 20 MG tablet Take 1 tablet (20 mg total) by mouth daily. 90 tablet 1   zinc gluconate 50 MG tablet Take 1 tablet (50 mg total) by mouth daily. 90 tablet 0   digoxin (LANOXIN) 0.125 MG tablet Take 1 tablet (0.125 mg total) by mouth daily. 90 tablet 3   losartan (COZAAR) 25 MG tablet Take 0.5 tablets (12.5 mg total) by mouth daily. (Patient not taking: Reported on 10/19/2022) 30 tablet 1   metFORMIN (GLUCOPHAGE-XR) 500 MG 24 hr tablet Take 1 tablet (500 mg total) by mouth 2 times daily at 12 noon and 4 pm. (Patient not taking: Reported on 10/19/2022) 180 tablet 1   metoprolol succinate (TOPROL-XL) 25 MG 24 hr tablet Take 1 tablet (25 mg total) by mouth daily. 90 tablet 3   No current facility-administered medications for this visit.    Allergies:   Excedrin migraine  [aspirin-acetaminophen-caffeine], Lisinopril, and Entresto [sacubitril-valsartan]    Social History:  The patient  reports that she has never smoked. She has  never used smokeless tobacco. She reports current alcohol use of about 1.0 standard drink of alcohol per week. She reports that she does not use drugs.   Family History:  The patient's family history is not on file.    ROS:  Please see the history of present illness.   Otherwise, review of systems are positive for none.   All other systems are reviewed and negative.    PHYSICAL EXAM: VS:  BP (!) 120/90 (BP Location: Left Arm, Patient Position: Sitting, Cuff Size: Large)   Pulse 75   Ht 5\' 6"  (1.676 m)   Wt 275 lb 2 oz (124.8 kg)   SpO2 99%   BMI 44.41 kg/m  , BMI Body mass index is 44.41 kg/m. GEN: Well nourished, well developed, in no acute distress  HEENT: normal  Neck: no JVD,  carotid bruits, or masses Cardiac: RRR; no murmurs, rubs, or gallops,no edema  Respiratory:  clear to auscultation bilaterally, normal work of breathing GI: soft, nontender, nondistended, + BS MS: no deformity or atrophy  Skin: warm and dry, no rash Neuro:  Strength and sensation are intact Psych: euthymic mood, full affect   EKG:  EKG is  ordered today. EKG showed normal sinus rhythm with low voltage.  Heart rate 75 bpm.  Recent Labs: 05/24/2022: B Natriuretic Peptide 825.3 05/25/2022: Hemoglobin 12.9; Platelets 235 08/01/2022: BUN 12; Creatinine, Ser 0.72; Potassium 4.0; Sodium 139    Lipid Panel No results found for: "CHOL", "TRIG", "HDL", "CHOLHDL", "VLDL", "LDLCALC", "LDLDIRECT"    Wt Readings from Last 3 Encounters:  10/19/22 275 lb 2 oz (124.8 kg)  07/21/22 277 lb 4 oz (125.8 kg)  06/16/22 274 lb 8 oz (124.5 kg)          No data to display            ASSESSMENT AND PLAN:  1.  Chronic systolic heart failure: She continues to be [place] heart association class II.   She appears to be euvolemic on current dose of torsemide 20 mg once daily.   She did not tolerate Entresto in the past due to reported nausea and low blood pressure.   I discussed the importance of compliance with medications.  She reports difficulty getting her medications refilled.   I refilled Toprol and digoxin today.  Continue spironolactone. Will plan on resuming losartan during next visit.  2.  Mildly elevated troponin: Likely due to supply demand ischemia in the setting of acute on chronic heart failure and sinus tachycardia.  The patient declined cardiac catheterization and clinically seems to be stable and thus we will hold off.  Once we have her on the right medication for heart failure, we will plan on repeating echocardiogram to reevaluate ejection fraction.  3.  Obesity: She will be starting Ozempic.  4.  Hyperlipidemia: Currently on atorvastatin 20 mg daily.    Disposition:   FU in  3 months. Signed,  Lorine Bears, MD  10/19/2022 10:24 AM    [facility]

## 2022-10-19 ENCOUNTER — Ambulatory Visit: Payer: Medicare Other | Attending: Cardiovascular Disease | Admitting: Cardiovascular Disease

## 2022-10-19 ENCOUNTER — Encounter: Payer: Self-pay | Admitting: Cardiovascular Disease

## 2022-10-19 VITALS — BP 120/90 | HR 75 | Ht 66.0 in | Wt 275.1 lb

## 2022-10-19 DIAGNOSIS — E785 Hyperlipidemia, unspecified: Secondary | ICD-10-CM | POA: Diagnosis present

## 2022-10-19 DIAGNOSIS — I5022 Chronic systolic (congestive) heart failure: Secondary | ICD-10-CM

## 2022-10-19 MED ORDER — DIGOXIN 125 MCG PO TABS: 0.1250 mg | ORAL_TABLET | Freq: Every day | ORAL | 3 refills | Status: DC

## 2022-10-19 MED ORDER — METOPROLOL SUCCINATE ER 25 MG PO TB24: 25.0000 mg | ORAL_TABLET | Freq: Every day | ORAL | 3 refills | Status: DC

## 2022-10-19 NOTE — Patient Instructions (Signed)
Medication Instructions:  No changes *If you need a refill on your cardiac medications before your next appointment, please call your pharmacy*   Lab Work: None ordered If you have labs (blood work) drawn today and your tests are completely normal, you will receive your results only by: MyChart Message (if you have MyChart) OR A paper copy in the mail If you have any lab test that is abnormal or we need to change your treatment, we will call you to review the results.   Testing/Procedures: None ordered   Follow-Up: At [facility], you and your health needs are our priority.  As part of our continuing mission to provide you with exceptional heart care, we have created designated Provider Care Teams.  These Care Teams include your primary Cardiologist (physician) and Advanced Practice Providers (APPs -  Physician Assistants and Nurse Practitioners) who all work together to provide you with the care you need, when you need it.  We recommend signing up for the patient portal called "MyChart".  Sign up information is provided on this After Visit Summary.  MyChart is used to connect with patients for Virtual Visits (Telemedicine).  Patients are able to view lab/test results, encounter notes, upcoming appointments, etc.  Non-urgent messages can be sent to your provider as well.   To learn more about what you can do with MyChart, go to [email or web address].    Your next appointment:   3 month(s)  Provider:   You may see Muhammad Arida, MD or one of the following Advanced Practice Providers on your designated Care Team:   Christopher Berge, NP Ryan Dunn, PA-C Cadence Furth, PA-C Sheri Hammock, NP     

## 2022-12-04 ENCOUNTER — Telehealth: Payer: Self-pay | Admitting: Cardiovascular Disease

## 2022-12-04 MED ORDER — TORSEMIDE 20 MG PO TABS: 20.0000 mg | ORAL_TABLET | Freq: Every day | ORAL | 0 refills | Status: DC

## 2022-12-04 NOTE — Telephone Encounter (Signed)
*  STAT* If patient is at the pharmacy, call can be transferred to refill team.   1. Which medications need to be refilled? (please list name of each medication and dose if known)   torsemide (DEMADEX) 20 MG tablet    2. Which pharmacy/location (including street and city if local pharmacy) is medication to be sent to?  [id number] - [place], [place] - [address]     3. Do they need a 30 day or 90 day supply? 90   Pt states she is completely out

## 2022-12-04 NOTE — Telephone Encounter (Signed)
Requested Prescriptions   Signed Prescriptions Disp Refills   torsemide (DEMADEX) 20 MG tablet 90 tablet 0    Sig: Take 1 tablet (20 mg total) by mouth daily.    Authorizing Provider: ARIDA, MUHAMMAD A    Ordering User: NEWCOMER MCCLAIN, Dezmon Conover L    

## 2022-12-24 ENCOUNTER — Other Ambulatory Visit: Payer: Self-pay | Admitting: Cardiovascular Disease

## 2023-01-25 ENCOUNTER — Ambulatory Visit: Payer: Medicare Other | Admitting: Cardiovascular Disease

## 2023-03-01 ENCOUNTER — Other Ambulatory Visit: Payer: Self-pay | Admitting: Cardiovascular Disease

## 2023-04-06 ENCOUNTER — Ambulatory Visit: Payer: Medicare Other | Admitting: Cardiovascular Disease

## 2023-04-13 ENCOUNTER — Ambulatory Visit: Payer: Medicare Other | Attending: Medical | Admitting: Medical

## 2023-04-13 NOTE — Progress Notes (Deleted)
Cardiology Office Note:    Date:  04/13/2023   ID:  Pamela Kramer, DOB 22-Jul-1955, MRN [id number]  PCP:  Charlane Ferretti, DO  [facility] Cardiologist:  Lorine Bears, MD  [facility] Electrophysiologist:  None   Referring MD: Charlane Ferretti, DO   Chief Complaint: ***  History of Present Illness:    Pamela Kramer is a 67 y.o. female with a hx of chronic systolic heart failure, type 2 diabetes, obesity, hypertension who is being seen for follow-up.  Patient was seen by Dr. Mariah Milling in 2020 for newly diagnosed heart failure with an EF of 35 to 40%.  A nuclear stress test was done that showed no clear perfusion defects and she never had cardiac cath performed.  Patient was lost to follow-up.  Patient was hospitalized in December 2023 with the flu and noted to be tachycardic.  Troponin was mildly elevated.  Echo showed LVEF 30 to 35% with global hypokinesis.  She was mildly volume overloaded and this improved with diuresis.  Patient was started on Toprol, losartan and digoxin.  She lost 35 pounds with diuresis.  Patient was last seen in May 2024 and was overall doing well from a cardiac perspective.  Today,  Restart losartan  Past Medical History:  Diagnosis Date   CHF (congestive heart failure) (HCC)    Hyperlipidemia    Hypertension    Pre-diabetes     Past Surgical History:  Procedure Laterality Date   TUBAL LIGATION      Current Medications: No outpatient medications have been marked as taking for the 04/13/23 encounter (Appointment) with Fransico Michael, Tavyn Kurka H, PA-C.     Allergies:   Excedrin migraine  [aspirin-acetaminophen-caffeine], Lisinopril, and Entresto [sacubitril-valsartan]   Social History   Socioeconomic History   Marital status: Married    Spouse name: Not on file   Number of children: Not on file   Years of education: Not on file   Highest education level: Not on file  Occupational History   Not on file  Tobacco Use   Smoking status: Never    Smokeless tobacco: Never  Substance and Sexual Activity   Alcohol use: Yes    Alcohol/week: 1.0 standard drink of alcohol    Types: 1 Glasses of wine per week   Drug use: No   Sexual activity: Not Currently  Other Topics Concern   Not on file  Social History Narrative   Not on file   Social Determinants of Health   Financial Resource Strain: Low Risk  (03/28/2019)   Overall Financial Resource Strain (CARDIA)    Difficulty of Paying Living Expenses: Not hard at all  Food Insecurity: No Food Insecurity (05/25/2022)   Hunger Vital Sign    Worried About Running Out of Food in the Last Year: Never true    Ran Out of Food in the Last Year: Never true  Transportation Needs: No Transportation Needs (05/25/2022)   PRAPARE - Administrator, Civil Service (Medical): No    Lack of Transportation (Non-Medical): No  Physical Activity: Unknown (03/28/2019)   Exercise Vital Sign    Days of Exercise per Week: 7 days    Minutes of Exercise per Session: Not on file  Stress: No Stress Concern Present (03/28/2019)   [employer] of Occupational Health - Occupational Stress Questionnaire    Feeling of Stress : Not at all  Social Connections: Unknown (10/10/2021)   Received from [facility]   Social Network    Social Network:  Not on file     Family History: The patient's ***family history is not on file.  ROS:   Please see the history of present illness.    *** All other systems reviewed and are negative.  EKGs/Labs/Other Studies Reviewed:    The following studies were reviewed today: ***  EKG:  EKG is *** ordered today.  The ekg ordered today demonstrates ***  Recent Labs: 05/24/2022: B Natriuretic Peptide 825.3 05/25/2022: Hemoglobin 12.9; Platelets 235 08/01/2022: BUN 12; Creatinine, Ser 0.72; Potassium 4.0; Sodium 139  Recent Lipid Panel No results found for: "CHOL", "TRIG", "HDL", "CHOLHDL", "VLDL", "LDLCALC", "LDLDIRECT"   Risk  Assessment/Calculations:   {Does this patient have ATRIAL FIBRILLATION?:[phone]}   Physical Exam:    VS:  There were no vitals taken for this visit.    Wt Readings from Last 3 Encounters:  10/19/22 275 lb 2 oz (124.8 kg)  07/21/22 277 lb 4 oz (125.8 kg)  06/16/22 274 lb 8 oz (124.5 kg)     GEN: *** Well nourished, well developed in no acute distress HEENT: Normal NECK: No JVD; No carotid bruits LYMPHATICS: No lymphadenopathy CARDIAC: ***RRR, no murmurs, rubs, gallops RESPIRATORY:  Clear to auscultation without rales, wheezing or rhonchi  ABDOMEN: Soft, non-tender, non-distended MUSCULOSKELETAL:  No edema; No deformity  SKIN: Warm and dry NEUROLOGIC:  Alert and oriented x 3 PSYCHIATRIC:  Normal affect   ASSESSMENT:    No diagnosis found. PLAN:    In order of problems listed above:  ***  Disposition: Follow up {follow up:15908} with ***   Shared Decision Making/Informed Consent   {Are you ordering a CV Procedure (e.g. stress test, cath, DCCV, TEE, etc)?   Press F2        :[id number]}    Signed, Sunnie Odden David Stall, PA-C  04/13/2023 7:52 AM    [facility]

## 2023-05-27 ENCOUNTER — Other Ambulatory Visit: Payer: Self-pay | Admitting: Cardiovascular Disease

## 2023-05-28 NOTE — Telephone Encounter (Signed)
Please contact pt for future appointment. ?Pt overdue for 3 month f/u. ?

## 2023-06-11 NOTE — Progress Notes (Signed)
 Cardiology Office Note    Date:  06/12/2023   ID:  Pamela Kramer, DOB 01-17-1956, MRN [id number]  PCP:  Valentin Skates, DO  Cardiologist:  Deatrice Cage, MD  Electrophysiologist:  None   Chief Complaint: Follow-up  History of Present Illness:   Pamela Kramer is a 68 y.o. female with history of HFrEF, DM2, HTN, HLD, and obesity who presents for follow-up of her cardiomyopathy.  She was previously seen by Dr. Florencio in 2020 for newly diagnosed heart failure with an EF of 35 to 40%.  Nuclear stress test was done that showed no clear perfusion defects.  R/LHC was not pursued.  Starkly, she has been frustrated by multiple adverse effects of GDMT and has wanted to follow a holistic approach, and in this setting has previously not kept a follow-up appointments.  She was admitted with influenza in 2023 and noted to be tachycardic.  Troponin was mildly elevated.  Echo showed an EF of 30 to 35% with global hypokinesis.  She was mildly volume overloaded and diuresed, losing a total of 35 pounds with diuresis.  She was initiated on Toprol , losartan , and digoxin .  He was most recently seen in the office in 09/2022 and was without symptoms of angina or cardiac decompensation.  She indicated she had ran out of metoprolol  and digoxin  several weeks prior.  Her elevated troponin was felt to be due to supply/demand ischemia in the setting of acute on chronic heart failure and sinus tachycardia with influenza.  She declined cardiac cath.  She comes in doing well from a cardiac perspective and is without symptoms of angina or cardiac decompensation.  She indicates this is the best she has felt in over a year.  No dyspnea, palpitations, presyncope, or syncope.  She does note some positional dizziness if she stands quickly.  She is adherent and tolerating cardiac medications.  Blood pressure is well-controlled at home in the 120s over 70s-80s.  She does watch her salt intake.  Her weight is down 33 pounds today when  compared to her visit in 09/2022 with Ozempic.  She indicates she is now able to ambulate in from the far end of parking lots without cardiac limitation.  She is pleased with her progress.   Labs independently reviewed: 07/2022 - potassium 4.0, BUN 12, serum creatinine 0.72 05/2022 - digoxin  0.7 04/2022 - Hgb 12.9, PLT 235, A1c 6.1 02/2020 - TC 166, TG 92, HDL 48, LDL 101, TSH normal   Past Medical History:  Diagnosis Date   CHF (congestive heart failure) (HCC)    Hyperlipidemia    Hypertension    Pre-diabetes     Past Surgical History:  Procedure Laterality Date   TUBAL LIGATION      Current Medications: Current Meds  Medication Sig   Acetaminophen  (TYLENOL  PO) Take by mouth daily as needed.   atorvastatin  (LIPITOR) 20 MG tablet Take 1 tablet (20 mg total) by mouth daily.   Cholecalciferol (VITAMIN D3) 10 MCG (400 UNIT) tablet Take 2,000 Units by mouth daily.   digoxin  (LANOXIN ) 0.125 MG tablet Take 1 tablet (0.125 mg total) by mouth daily.   losartan  (COZAAR ) 25 MG tablet Take 0.5 tablets (12.5 mg total) by mouth daily.   metoprolol  succinate (TOPROL -XL) 25 MG 24 hr tablet Take 1 tablet (25 mg total) by mouth daily.   Semaglutide, 1 MG/DOSE, (OZEMPIC, 1 MG/DOSE,) 4 MG/3ML SOPN Inject 2 mg into the skin once a week.   Semaglutide, 2 MG/DOSE, (OZEMPIC, 2 MG/DOSE,) 8 MG/3ML  SOPN inject 2mg  Subcutaneous weekly   spironolactone  (ALDACTONE ) 25 MG tablet TAKE 1/2 TABLET(12.5 MG) BY MOUTH DAILY   torsemide  (DEMADEX ) 20 MG tablet TAKE 1 TABLET(20 MG) BY MOUTH DAILY   zinc  gluconate 50 MG tablet Take 1 tablet (50 mg total) by mouth daily.    Allergies:   Excedrin migraine  [aspirin -acetaminophen -caffeine], Lisinopril , and Entresto  [sacubitril -valsartan ]   Social History   Socioeconomic History   Marital status: Married    Spouse name: Not on file   Number of children: Not on file   Years of education: Not on file   Highest education level: Not on file  Occupational History    Not on file  Tobacco Use   Smoking status: Never   Smokeless tobacco: Never  Substance and Sexual Activity   Alcohol use: Yes    Alcohol/week: 1.0 standard drink of alcohol    Types: 1 Glasses of wine per week   Drug use: No   Sexual activity: Not Currently  Other Topics Concern   Not on file  Social History Narrative   Not on file   Social Drivers of Health   Financial Resource Strain: Low Risk  (03/28/2019)   Overall Financial Resource Strain (CARDIA)    Difficulty of Paying Living Expenses: Not hard at all  Food Insecurity: No Food Insecurity (05/25/2022)   Hunger Vital Sign    Worried About Running Out of Food in the Last Year: Never true    Ran Out of Food in the Last Year: Never true  Transportation Needs: No Transportation Needs (05/25/2022)   PRAPARE - Administrator, Civil Service (Medical): No    Lack of Transportation (Non-Medical): No  Physical Activity: Unknown (03/28/2019)   Exercise Vital Sign    Days of Exercise per Week: 7 days    Minutes of Exercise per Session: Not on file  Stress: No Stress Concern Present (03/28/2019)   [employer] of Occupational Health - Occupational Stress Questionnaire    Feeling of Stress : Not at all  Social Connections: Unknown (10/10/2021)   Received from [facility]   Social Network    Social Network: Not on file     Family History:  The patient's family history is not on file.  ROS:   12-point review of systems is negative unless otherwise noted in the HPI.   EKGs/Labs/Other Studies Reviewed:    Studies reviewed were summarized above. The additional studies were reviewed today:  2D echo 05/24/2022: 1. Technically difficult study.   2. Left ventricular ejection fraction, by estimation, is 30 to 35%. The  left ventricle has moderately decreased function. The left ventricle  demonstrates global hypokinesis. The left ventricular internal cavity size  was moderately dilated. There is  mild   left ventricular hypertrophy. Left ventricular diastolic function could  not be evaluated.   3. Right ventricular systolic function is mildly reduced. The right  ventricular size is mildly enlarged. Mildly increased right ventricular  wall thickness.   4. Left atrial size was severely dilated.   5. Right atrial size was mildly dilated.   6. The mitral valve was not well visualized. No evidence of mitral valve  regurgitation.   7. The aortic valve was not well visualized. Aortic valve regurgitation  is not visualized.  __________  Lexiscan  MPI 04/18/2019 [place]): The study is normal. This is a low risk study. The left ventricular ejection fraction is normal (55-65%). Blood pressure demonstrated a normal response to exercise. __________  2D echo 03/12/2019 [facility]ina): 1. Left ventricular ejection fraction, by visual estimation, is 35 to  40%. The left ventricle has moderately decreased function. Normal left  ventricular size. Left ventricular septal wall thickness was normal. There  is no left ventricular hypertrophy.   2. Global right ventricle has mildly reduced systolic function.The right  ventricular size is normal. Mildly increased right ventricular wall  thickness.   3. Left atrial size was mildly dilated.   4. Right atrial size was mildly dilated.   5. The mitral valve is grossly normal. Mild mitral valve regurgitation.   6. The tricuspid valve is grossly normal. Tricuspid valve regurgitation  is mild.   7. The aortic valve is normal in structure. Aortic valve regurgitation is  trivial by color flow Doppler.   8. The pulmonic valve was grossly normal. Pulmonic valve regurgitation is  not visualized by color flow Doppler.    EKG:  EKG is ordered today.  The EKG ordered today demonstrates NSR, 66 bpm,, no acute ST-T changes  Recent Labs: 08/01/2022: BUN 12; Creatinine, Ser 0.72; Potassium 4.0; Sodium 139  Recent Lipid Panel No results found for: CHOL, TRIG,  HDL, CHOLHDL, VLDL, LDLCALC, LDLDIRECT  PHYSICAL EXAM:    VS:  BP (!) 120/92 (BP Location: Left Arm, Patient Position: Sitting, Cuff Size: Normal)   Pulse 66   Ht 5' 6 (1.676 m)   Wt 242 lb (109.8 kg)   SpO2 97%   BMI 39.06 kg/m   BMI: Body mass index is 39.06 kg/m.  Physical Exam Vitals reviewed.  Constitutional:      Appearance: She is well-developed.  HENT:     Head: Normocephalic and atraumatic.  Eyes:     General:        Right eye: No discharge.        Left eye: No discharge.  Neck:     Vascular: No JVD.  Cardiovascular:     Rate and Rhythm: Normal rate and regular rhythm.     Heart sounds: Normal heart sounds, S1 normal and S2 normal. Heart sounds not distant. No midsystolic click and no opening snap. No murmur heard.    No friction rub.  Pulmonary:     Effort: Pulmonary effort is normal. No respiratory distress.     Breath sounds: Normal breath sounds. No decreased breath sounds, wheezing, rhonchi or rales.  Chest:     Chest wall: No tenderness.  Abdominal:     General: There is no distension.  Musculoskeletal:     Cervical back: Normal range of motion.     Right lower leg: No edema.     Left lower leg: No edema.  Skin:    General: Skin is warm and dry.     Nails: There is no clubbing.  Neurological:     Mental Status: She is alert and oriented to person, place, and time.  Psychiatric:        Speech: Speech normal.        Behavior: Behavior normal.        Thought Content: Thought content normal.        Judgment: Judgment normal.     Wt Readings from Last 3 Encounters:  06/12/23 242 lb (109.8 kg)  10/19/22 275 lb 2 oz (124.8 kg)  07/21/22 277 lb 4 oz (125.8 kg)     ASSESSMENT & PLAN:   HFrEF: Euvolemic and well compensated with NYHA class II symptoms.  She remains on digoxin  0.125 mg, losartan  12.5 mg, Toprol -XL 25  mg, and spironolactone  12.5 mg.  Not requiring a standing loop diuretic.  With positional dizziness, defer escalation of GDMT  at this time.  Now that she has been maintained on maximally tolerated GDMT for several months, repeat echo to evaluate for improvement in cardiomyopathy.  Should her cardiomyopathy persist, would need to revisit diagnostic cardiac cath.  CHF education.  Check CMP and digoxin .  HTN: Blood pressure is well-controlled in the office today.  Continue medical therapy as outlined above.  HLD: LDL 101 in 2021.  Remains on atorvastatin  20 mg.  Check lipid panel.  Obesity: Her weight is down 33 pounds today when compared to her last visit in 09/2022.  Currently on Ozempic.  Management per PCP.     Disposition: F/u with Dr. Darron or an APP in 6 months.   Medication Adjustments/Labs and Tests Ordered: Current medicines are reviewed at length with the patient today.  Concerns regarding medicines are outlined above. Medication changes, Labs and Tests ordered today are summarized above and listed in the Patient Instructions accessible in Encounters.   Signed, Bernardino Bring, PA-C 06/12/2023 1:13 PM     [facility] [address] [place], [place] [address] [phone]

## 2023-06-12 ENCOUNTER — Ambulatory Visit: Payer: Medicare Other | Attending: Physician Assistant | Admitting: Physician Assistant

## 2023-06-12 ENCOUNTER — Encounter: Payer: Self-pay | Admitting: Physician Assistant

## 2023-06-12 VITALS — BP 120/92 | HR 66 | Ht 66.0 in | Wt 242.0 lb

## 2023-06-12 DIAGNOSIS — E785 Hyperlipidemia, unspecified: Secondary | ICD-10-CM | POA: Diagnosis not present

## 2023-06-12 DIAGNOSIS — I5020 Unspecified systolic (congestive) heart failure: Secondary | ICD-10-CM | POA: Insufficient documentation

## 2023-06-12 DIAGNOSIS — Z79899 Other long term (current) drug therapy: Secondary | ICD-10-CM | POA: Insufficient documentation

## 2023-06-12 DIAGNOSIS — Z6839 Body mass index (BMI) 39.0-39.9, adult: Secondary | ICD-10-CM | POA: Diagnosis present

## 2023-06-12 DIAGNOSIS — I10 Essential (primary) hypertension: Secondary | ICD-10-CM | POA: Diagnosis present

## 2023-06-12 DIAGNOSIS — E66812 Obesity, class 2: Secondary | ICD-10-CM | POA: Diagnosis present

## 2023-06-12 NOTE — Patient Instructions (Signed)
 Medication Instructions:  Your Physician recommend you continue on your current medication as directed.    *If you need a refill on your cardiac medications before your next appointment, please call your pharmacy*   Lab Work: Your provider would like for you to have following labs drawn today CMeT, Digoxin  level, and Lipid Level.   If you have labs (blood work) drawn today and your tests are completely normal, you will receive your results only by: MyChart Message (if you have MyChart) OR A paper copy in the mail If you have any lab test that is abnormal or we need to change your treatment, we will call you to review the results.   Testing/Procedures: Your physician has requested that you have an echocardiogram. Echocardiography is a painless test that uses sound waves to create images of your heart. It provides your doctor with information about the size and shape of your heart and how well your heart's chambers and valves are working.   You may receive an ultrasound enhancing agent through an IV if needed to better visualize your heart during the echo. This procedure takes approximately one hour.  There are no restrictions for this procedure.  This will take place at [address] [facility]) [address], [place] [address]  Please note: We ask at that you not bring children with you during ultrasound (echo/ vascular) testing. Due to room size and safety concerns, children are not allowed in the ultrasound rooms during exams. Our front office staff cannot provide observation of children in our lobby area while testing is being conducted. An adult accompanying a patient to their appointment will only be allowed in the ultrasound room at the discretion of the ultrasound technician under special circumstances. We apologize for any inconvenience.    Follow-Up: At [facility], you and your health needs are our priority.  As part of our continuing mission to provide you with  exceptional heart care, we have created designated Provider Care Teams.  These Care Teams include your primary Cardiologist (physician) and Advanced Practice Providers (APPs -  Physician Assistants and Nurse Practitioners) who all work together to provide you with the care you need, when you need it.  We recommend signing up for the patient portal called MyChart.  Sign up information is provided on this After Visit Summary.  MyChart is used to connect with patients for Virtual Visits (Telemedicine).  Patients are able to view lab/test results, encounter notes, upcoming appointments, etc.  Non-urgent messages can be sent to your provider as well.   To learn more about what you can do with MyChart, go to [email or web address].    Your next appointment:   6 month(s)  Provider:   You may see Deatrice Cage, MD or one of the following Advanced Practice Providers on your designated Care Team:   Bernardino Bring, PA-C

## 2023-06-13 LAB — COMPREHENSIVE METABOLIC PANEL
ALT: 19 [IU]/L (ref 0–32)
AST: 19 [IU]/L (ref 0–40)
Albumin: 4.4 g/dL (ref 3.9–4.9)
Alkaline Phosphatase: 101 [IU]/L (ref 44–121)
BUN/Creatinine Ratio: 16 (ref 12–28)
BUN: 12 mg/dL (ref 8–27)
Bilirubin Total: 0.2 mg/dL (ref 0.0–1.2)
CO2: 19 mmol/L — ABNORMAL LOW (ref 20–29)
Calcium: 9.7 mg/dL (ref 8.7–10.3)
Chloride: 105 mmol/L (ref 96–106)
Creatinine, Ser: 0.73 mg/dL (ref 0.57–1.00)
Globulin, Total: 2.2 g/dL (ref 1.5–4.5)
Glucose: 91 mg/dL (ref 70–99)
Potassium: 4.5 mmol/L (ref 3.5–5.2)
Sodium: 143 mmol/L (ref 134–144)
Total Protein: 6.6 g/dL (ref 6.0–8.5)
eGFR: 90 mL/min/{1.73_m2} (ref >59)

## 2023-06-13 LAB — LIPID PANEL
Chol/HDL Ratio: 3.8 {ratio} (ref 0.0–4.4)
Cholesterol, Total: 177 mg/dL (ref 100–199)
HDL: 46 mg/dL (ref >39)
LDL Chol Calc (NIH): 106 mg/dL — ABNORMAL HIGH (ref 0–99)
Triglycerides: 139 mg/dL (ref 0–149)
VLDL Cholesterol Cal: 25 mg/dL (ref 5–40)

## 2023-06-13 LAB — DIGOXIN LEVEL: Digoxin, Serum: 0.5 ng/mL (ref 0.5–0.9)

## 2023-06-26 ENCOUNTER — Ambulatory Visit: Payer: Medicare Other | Attending: Physician Assistant

## 2023-06-26 DIAGNOSIS — I5020 Unspecified systolic (congestive) heart failure: Secondary | ICD-10-CM | POA: Insufficient documentation

## 2023-06-26 LAB — ECHOCARDIOGRAM COMPLETE
AR max vel: 2.09 cm2
AV Area VTI: 2.18 cm2
AV Area mean vel: 1.94 cm2
AV Mean grad: 9 mm[Hg]
AV Peak grad: 17 mm[Hg]
Ao pk vel: 2.06 m/s
Area-P 1/2: 3.37 cm2
S' Lateral: 4.6 cm

## 2023-06-29 ENCOUNTER — Other Ambulatory Visit: Payer: Self-pay | Admitting: Cardiovascular Disease

## 2023-08-25 ENCOUNTER — Other Ambulatory Visit: Payer: Self-pay | Admitting: Cardiovascular Disease

## 2023-11-24 ENCOUNTER — Other Ambulatory Visit: Payer: Self-pay | Admitting: Cardiovascular Disease

## 2023-11-26 NOTE — Telephone Encounter (Signed)
Please contact pt for 6 month f/u.

## 2023-11-26 NOTE — Telephone Encounter (Signed)
 Unable to leave voicemail.

## 2023-12-04 NOTE — Telephone Encounter (Signed)
 Spoke with patient's son, he said that he will tell his mother to call and schedule appt and to also provide updated phone number.

## 2023-12-05 NOTE — Telephone Encounter (Signed)
Pt scheduled on 7/22 

## 2023-12-18 ENCOUNTER — Ambulatory Visit: Admitting: Physician Assistant

## 2024-01-30 NOTE — Progress Notes (Deleted)
 Cardiology Office Note    Date:  01/30/2024   ID:  Pamela Kramer, DOB 10-13-1955, MRN [id number]  PCP:  Valentin Skates, DO  Cardiologist:  Deatrice Cage, MD  Electrophysiologist:  None   Chief Complaint: Follow up  History of Present Illness:   Pamela Kramer is a 68 y.o. female with history of HFimpEF, DM2, HTN, HLD, and obesity who presents for follow-up of her cardiomyopathy.   She was previously seen by Dr. Florencio in 2020 for newly diagnosed heart failure with an EF of 35 to 40%.  Nuclear stress test was done that showed no clear perfusion defects.  R/LHC was not pursued.  [place], she has been frustrated by multiple adverse effects of GDMT and has wanted to follow a holistic approach, and in this setting has previously not kept a follow-up appointments.  She was admitted with influenza in 2023 and noted to be tachycardic.  Troponin was mildly elevated.  Echo showed an EF of 30 to 35% with global hypokinesis.  She was mildly volume overloaded and diuresed, losing a total of 35 pounds with diuresis.  She was initiated on Toprol , losartan , and digoxin .  In follow-up in 09/2022, she had run out of metoprolol  and digoxin  several weeks prior.  She declined cardiac cath.  She was most recently seen in 05/2023 and was doing very well from a cardiac perspective.  Escalation of GDMT was limited by positional dizziness.  Follow-up echo in 05/2023 showed a low normal LV systolic function with an EF of 50 to 55%, no regional wall motion abnormalities, grade 1 diastolic dysfunction, normal RV systolic function diverticular cavity size, mild mitral regurgitation, and aortic valve sclerosis without evidence of stenosis.  ***   Labs independently reviewed: 05/2023 - digoxin  0.5, TC 177, TG 139, HDL 46, LDL 106, BUN 12, serum creatinine 0.73, potassium 4.5, albumin 4.4, AST/ALT normal 04/2022 - Hgb 12.9, PLT 235, A1c 6.1   Past Medical History:  Diagnosis Date   CHF (congestive heart failure) (HCC)     Hyperlipidemia    Hypertension    Pre-diabetes     Past Surgical History:  Procedure Laterality Date   TUBAL LIGATION      Current Medications: No outpatient medications have been marked as taking for the 01/31/24 encounter (Appointment) with Abigail Bernardino HERO, PA-C.    Allergies:   Excedrin migraine  [aspirin -acetaminophen -caffeine], Lisinopril , and Entresto  [sacubitril -valsartan ]   Social History   Socioeconomic History   Marital status: Married    Spouse name: Not on file   Number of children: Not on file   Years of education: Not on file   Highest education level: Not on file  Occupational History   Not on file  Tobacco Use   Smoking status: Never   Smokeless tobacco: Never  Substance and Sexual Activity   Alcohol use: Yes    Alcohol/week: 1.0 standard drink of alcohol    Types: 1 Glasses of wine per week   Drug use: No   Sexual activity: Not Currently  Other Topics Concern   Not on file  Social History Narrative   Not on file   Social Drivers of Health   Financial Resource Strain: Low Risk  (03/28/2019)   Overall Financial Resource Strain (CARDIA)    Difficulty of Paying Living Expenses: Not hard at all  Food Insecurity: No Food Insecurity (05/25/2022)   Hunger Vital Sign    Worried About Running Out of Food in the Last Year: Never true    Ran  Out of Food in the Last Year: Never true  Transportation Needs: No Transportation Needs (05/25/2022)   PRAPARE - Administrator, Civil Service (Medical): No    Lack of Transportation (Non-Medical): No  Physical Activity: Unknown (03/28/2019)   Exercise Vital Sign    Days of Exercise per Week: 7 days    Minutes of Exercise per Session: Not on file  Stress: No Stress Concern Present (03/28/2019)   [employer] of Occupational Health - Occupational Stress Questionnaire    Feeling of Stress : Not at all  Social Connections: Unknown (10/10/2021)   [facility]   Social Network    Social  Network: Not on file     Family History:  The patient's family history is not on file.  ROS:   12-point review of systems is negative unless otherwise noted in the HPI.   EKGs/Labs/Other Studies Reviewed:    Studies reviewed were summarized above. The additional studies were reviewed today:  2D echo 06/26/2023: 1. Left ventricular ejection fraction, by estimation, is 50 to 55%. The  left ventricle has low normal function. The left ventricle has no regional  wall motion abnormalities. Left ventricular diastolic parameters are  consistent with Grade I diastolic  dysfunction (impaired relaxation). The average left ventricular global  longitudinal strain is -12.8 %. The global longitudinal strain is  abnormal.   2. Right ventricular systolic function is normal. The right ventricular  size is normal.   3. The mitral valve is normal in structure. Mild mitral valve  regurgitation. No evidence of mitral stenosis.   4. The aortic valve is normal in structure. Aortic valve regurgitation is  not visualized. Aortic valve sclerosis is present, with no evidence of  aortic valve stenosis.   5. The inferior vena cava is normal in size with greater than 50%  respiratory variability, suggesting right atrial pressure of 3 mmHg.  __________  2D echo 05/24/2022: 1. Technically difficult study.   2. Left ventricular ejection fraction, by estimation, is 30 to 35%. The  left ventricle has moderately decreased function. The left ventricle  demonstrates global hypokinesis. The left ventricular internal cavity size  was moderately dilated. There is mild   left ventricular hypertrophy. Left ventricular diastolic function could  not be evaluated.   3. Right ventricular systolic function is mildly reduced. The right  ventricular size is mildly enlarged. Mildly increased right ventricular  wall thickness.   4. Left atrial size was severely dilated.   5. Right atrial size was mildly dilated.   6. The  mitral valve was not well visualized. No evidence of mitral valve  regurgitation.   7. The aortic valve was not well visualized. Aortic valve regurgitation  is not visualized.  __________   Lexiscan  MPI 04/18/2019 [place]): The study is normal. This is a low risk study. The left ventricular ejection fraction is normal (55-65%). Blood pressure demonstrated a normal response to exercise. __________   2D echo 03/12/2019 [place]): 1. Left ventricular ejection fraction, by visual estimation, is 35 to  40%. The left ventricle has moderately decreased function. Normal left  ventricular size. Left ventricular septal wall thickness was normal. There  is no left ventricular hypertrophy.   2. Global right ventricle has mildly reduced systolic function.The right  ventricular size is normal. Mildly increased right ventricular wall  thickness.   3. Left atrial size was mildly dilated.   4. Right atrial size was mildly dilated.   5. The mitral valve is grossly  normal. Mild mitral valve regurgitation.   6. The tricuspid valve is grossly normal. Tricuspid valve regurgitation  is mild.   7. The aortic valve is normal in structure. Aortic valve regurgitation is  trivial by color flow Doppler.   8. The pulmonic valve was grossly normal. Pulmonic valve regurgitation is  not visualized by color flow Doppler.    EKG:  EKG is ordered today.  The EKG ordered today demonstrates ***  Recent Labs: 06/12/2023: ALT 19; BUN 12; Creatinine, Ser 0.73; Potassium 4.5; Sodium 143  Recent Lipid Panel    Component Value Date/Time   CHOL 177 06/12/2023 0903   TRIG 139 06/12/2023 0903   HDL 46 06/12/2023 0903   CHOLHDL 3.8 06/12/2023 0903   LDLCALC 106 (H) 06/12/2023 0903    PHYSICAL EXAM:    VS:  There were no vitals taken for this visit.  BMI: There is no height or weight on file to calculate BMI.  Physical Exam  Wt Readings from Last 3 Encounters:  06/12/23 242 lb (109.8 kg)  10/19/22 275 lb 2 oz  (124.8 kg)  07/21/22 277 lb 4 oz (125.8 kg)     ASSESSMENT & PLAN:   HFimpEF:  HTN: Blood pressure  HLD: LDL 106 in 05/2023.  Obesity:   {Are you ordering a CV Procedure (e.g. stress test, cath, DCCV, TEE, etc)?   Press F2        :[id number]}     Disposition: F/u with Dr. Darron or an APP in ***.   Medication Adjustments/Labs and Tests Ordered: Current medicines are reviewed at length with the patient today.  Concerns regarding medicines are outlined above. Medication changes, Labs and Tests ordered today are summarized above and listed in the Patient Instructions accessible in Encounters.   Signed, Bernardino Bring, PA-C 01/30/2024 2:23 PM     [facility] [place] [address] [place], [place] [address] [phone]

## 2024-01-31 ENCOUNTER — Ambulatory Visit: Admitting: Physician Assistant

## 2024-02-11 ENCOUNTER — Ambulatory Visit: Admitting: Physician Assistant

## 2024-02-19 NOTE — Progress Notes (Unsigned)
 Cardiology Office Note    Date:  02/20/2024   ID:  Pamela Kramer, DOB 10/14/1955, MRN [id number]  PCP:  Valentin Skates, DO  Cardiologist:  Deatrice Cage, MD  Electrophysiologist:  None   Chief Complaint: Follow up  History of Present Illness:   Pamela Kramer is a 68 y.o. female with history of HFimpEF, DM2, HTN, HLD, and obesity who presents for follow-up of her cardiomyopathy.   She was previously seen by Dr. Florencio in 2020 for newly diagnosed heart failure with an EF of 35 to 40%.  Nuclear stress test showed no clear perfusion defects.  R/LHC was not pursued.  She has been frustrated by multiple adverse effects of GDMT and has wanted to follow a holistic approach, and in this setting has previously not kept follow-up appointments.  She was admitted with influenza in 2023 and noted to be tachycardic.  Troponin was mildly elevated.  Echo showed an EF of 30 to 35% with global hypokinesis.  She was volume overloaded and diuresed, losing a total of 35 pounds with diuresis.  She was initiated on Toprol , losartan , and digoxin .  In follow-up in 09/2022, she had run out of metoprolol  and digoxin  several weeks prior.  She declined cardiac cath.  She was most recently seen in 05/2023 and was doing very well from a cardiac perspective.  Escalation of GDMT was limited by positional dizziness.  Follow-up echo in 05/2023 showed a low normal LV systolic function with an EF of 50 to 55%, no regional wall motion abnormalities, grade 1 diastolic dysfunction, normal RV systolic function diverticular cavity size, mild mitral regurgitation, and aortic valve sclerosis without evidence of stenosis.  She comes in doing very well from a cardiac perspective and is without symptoms of angina or cardiac decompensation.  She reports this is the best she has felt in several years.  No dyspnea, palpitations, dizziness, presyncope, or syncope.  No lower extremity swelling or progressive orthopnea.  She does report frequent  late-night snacking.  Remains on GLP-1 therapy without off target effect.  Weight is down 7 pounds today when compared to her visit in 05/2023.  Reports she is not taking losartan  as she noted significant fatigue with this.  With self discontinuation of losartan  fatigue improved.  Very pleased with her current progress.   Labs independently reviewed: 05/2023 - digoxin  0.5, TC 177, TG 139, HDL 46, LDL 106, BUN 12, serum creatinine 0.73, potassium 4.5, albumin 4.4, AST/ALT normal 04/2022 - Hgb 12.9, PLT 235, A1c 6.1   Past Medical History:  Diagnosis Date   CHF (congestive heart failure) (HCC)    Hyperlipidemia    Hypertension    Pre-diabetes     Past Surgical History:  Procedure Laterality Date   TUBAL LIGATION      Current Medications: Current Meds  Medication Sig   Acetaminophen  (TYLENOL  PO) Take by mouth daily as needed.   atorvastatin  (LIPITOR) 20 MG tablet Take 1 tablet (20 mg total) by mouth daily.   Cholecalciferol (VITAMIN D3) 10 MCG (400 UNIT) tablet Take 2,000 Units by mouth daily.   metoprolol  succinate (TOPROL -XL) 25 MG 24 hr tablet TAKE 1 TABLET(25 MG) BY MOUTH DAILY   Semaglutide, 2 MG/DOSE, (OZEMPIC, 2 MG/DOSE,) 8 MG/3ML SOPN inject 2mg  Subcutaneous weekly   spironolactone  (ALDACTONE ) 25 MG tablet TAKE 1/2 TABLET(12.5 MG) BY MOUTH DAILY   zinc  gluconate 50 MG tablet Take 1 tablet (50 mg total) by mouth daily.   [DISCONTINUED] digoxin  (LANOXIN ) 0.125 MG tablet TAKE 1 TABLET(0.125  MG) BY MOUTH DAILY   [DISCONTINUED] losartan  (COZAAR ) 25 MG tablet Take 0.5 tablets (12.5 mg total) by mouth daily.   [DISCONTINUED] torsemide  (DEMADEX ) 20 MG tablet TAKE 1 TABLET(20 MG) BY MOUTH DAILY    Allergies:   Excedrin migraine  [aspirin -acetaminophen -caffeine], Lisinopril , and Entresto  [sacubitril -valsartan ]   Social History   Socioeconomic History   Marital status: Married    Spouse name: Not on file   Number of children: Not on file   Years of education: Not on file   Highest  education level: Not on file  Occupational History   Not on file  Tobacco Use   Smoking status: Never   Smokeless tobacco: Never  Substance and Sexual Activity   Alcohol use: Yes    Alcohol/week: 1.0 standard drink of alcohol    Types: 1 Glasses of wine per week   Drug use: No   Sexual activity: Not Currently  Other Topics Concern   Not on file  Social History Narrative   Not on file   Social Drivers of Health   Financial Resource Strain: Low Risk  (03/28/2019)   Overall Financial Resource Strain (CARDIA)    Difficulty of Paying Living Expenses: Not hard at all  Food Insecurity: No Food Insecurity (05/25/2022)   Hunger Vital Sign    Worried About Running Out of Food in the Last Year: Never true    Ran Out of Food in the Last Year: Never true  Transportation Needs: No Transportation Needs (05/25/2022)   PRAPARE - Administrator, Civil Service (Medical): No    Lack of Transportation (Non-Medical): No  Physical Activity: Unknown (03/28/2019)   Exercise Vital Sign    Days of Exercise per Week: 7 days    Minutes of Exercise per Session: Not on file  Stress: No Stress Concern Present (03/28/2019)   [employer] of Occupational Health - Occupational Stress Questionnaire    Feeling of Stress : Not at all  Social Connections: Unknown (10/10/2021)   Received from [facility]   Social Network    Social Network: Not on file     Family History:  The patient's family history is not on file.  ROS:   12-point review of systems is negative unless otherwise noted in the HPI.   EKGs/Labs/Other Studies Reviewed:    Studies reviewed were summarized above. The additional studies were reviewed today:  2D echo 06/26/2023: 1. Left ventricular ejection fraction, by estimation, is 50 to 55%. The  left ventricle has low normal function. The left ventricle has no regional  wall motion abnormalities. Left ventricular diastolic parameters are  consistent with Grade I  diastolic  dysfunction (impaired relaxation). The average left ventricular global  longitudinal strain is -12.8 %. The global longitudinal strain is  abnormal.   2. Right ventricular systolic function is normal. The right ventricular  size is normal.   3. The mitral valve is normal in structure. Mild mitral valve  regurgitation. No evidence of mitral stenosis.   4. The aortic valve is normal in structure. Aortic valve regurgitation is  not visualized. Aortic valve sclerosis is present, with no evidence of  aortic valve stenosis.   5. The inferior vena cava is normal in size with greater than 50%  respiratory variability, suggesting right atrial pressure of 3 mmHg.  __________  2D echo 05/24/2022: 1. Technically difficult study.   2. Left ventricular ejection fraction, by estimation, is 30 to 35%. The  left ventricle has moderately decreased function. The left  ventricle  demonstrates global hypokinesis. The left ventricular internal cavity size  was moderately dilated. There is mild   left ventricular hypertrophy. Left ventricular diastolic function could  not be evaluated.   3. Right ventricular systolic function is mildly reduced. The right  ventricular size is mildly enlarged. Mildly increased right ventricular  wall thickness.   4. Left atrial size was severely dilated.   5. Right atrial size was mildly dilated.   6. The mitral valve was not well visualized. No evidence of mitral valve  regurgitation.   7. The aortic valve was not well visualized. Aortic valve regurgitation  is not visualized.  __________   Lexiscan  MPI 04/18/2019 [place]): The study is normal. This is a low risk study. The left ventricular ejection fraction is normal (55-65%). Blood pressure demonstrated a normal response to exercise. __________   2D echo 03/12/2019 [place]): 1. Left ventricular ejection fraction, by visual estimation, is 35 to  40%. The left ventricle has moderately decreased  function. Normal left  ventricular size. Left ventricular septal wall thickness was normal. There  is no left ventricular hypertrophy.   2. Global right ventricle has mildly reduced systolic function.The right  ventricular size is normal. Mildly increased right ventricular wall  thickness.   3. Left atrial size was mildly dilated.   4. Right atrial size was mildly dilated.   5. The mitral valve is grossly normal. Mild mitral valve regurgitation.   6. The tricuspid valve is grossly normal. Tricuspid valve regurgitation  is mild.   7. The aortic valve is normal in structure. Aortic valve regurgitation is  trivial by color flow Doppler.   8. The pulmonic valve was grossly normal. Pulmonic valve regurgitation is  not visualized by color flow Doppler.    EKG:  EKG is ordered today.  The EKG ordered today demonstrates NSR, 63 bpm, no acute ST-T changes  Recent Labs: 06/12/2023: ALT 19; BUN 12; Creatinine, Ser 0.73; Potassium 4.5; Sodium 143  Recent Lipid Panel    Component Value Date/Time   CHOL 177 06/12/2023 0903   TRIG 139 06/12/2023 0903   HDL 46 06/12/2023 0903   CHOLHDL 3.8 06/12/2023 0903   LDLCALC 106 (H) 06/12/2023 0903    PHYSICAL EXAM:    VS:  BP (!) 140/80 (BP Location: Left Arm, Patient Position: Sitting, Cuff Size: Large)   Pulse 63   Ht 5' 6 (1.676 m)   Wt 235 lb 12.8 oz (107 kg)   SpO2 99%   BMI 38.06 kg/m   BMI: Body mass index is 38.06 kg/m.  Physical Exam Vitals reviewed.  Constitutional:      Appearance: She is well-developed.  HENT:     Head: Normocephalic and atraumatic.  Eyes:     General:        Right eye: No discharge.        Left eye: No discharge.  Cardiovascular:     Rate and Rhythm: Normal rate and regular rhythm.     Heart sounds: S1 normal and S2 normal. Heart sounds not distant. No midsystolic click and no opening snap. Murmur heard.     Systolic murmur is present with a grade of 2/6 at the upper right sternal border.     No friction  rub.  Pulmonary:     Effort: Pulmonary effort is normal. No respiratory distress.     Breath sounds: Normal breath sounds. No decreased breath sounds, wheezing, rhonchi or rales.  Musculoskeletal:     Cervical back: Normal range  of motion.     Right lower leg: No edema.     Left lower leg: No edema.  Skin:    General: Skin is warm and dry.     Nails: There is no clubbing.  Neurological:     Mental Status: She is alert and oriented to person, place, and time.  Psychiatric:        Speech: Speech normal.        Behavior: Behavior normal.        Thought Content: Thought content normal.        Judgment: Judgment normal.     Wt Readings from Last 3 Encounters:  02/20/24 235 lb 12.8 oz (107 kg)  06/12/23 242 lb (109.8 kg)  10/19/22 275 lb 2 oz (124.8 kg)     ASSESSMENT & PLAN:   HFimpEF: Euvolemic and well compensated with NYHA class II symptoms.  Continue GDMT including Toprol -XL 25 mg, spironolactone  12.5 mg, and torsemide  20 mg.  She self discontinued losartan  secondary to fatigue with improvement in symptoms.  We will undergo a trial of holding digoxin  as well given normalization of LV systolic function.  CHF education.  Check BMP.  Aortic valve sclerosis: No evidence of stenosis on echo earlier this year.  Continue to monitor.  HTN: Blood pressure is mildly elevated in the office today though typically well-controlled at home.  Continue Toprol -XL and spironolactone  as above.  Low-sodium diet encouraged.  HLD: LDL 106 in 05/2023.  Remains on atorvastatin  20 mg.  Check AST, ALT, lipid, and direct LDL.  Obesity: Heart healthy diet and regular exercise recommended.  Currently on GLP-1 therapy.     Disposition: F/u with Dr. Darron or an APP in 6 months.   Medication Adjustments/Labs and Tests Ordered: Current medicines are reviewed at length with the patient today.  Concerns regarding medicines are outlined above. Medication changes, Labs and Tests ordered today are summarized  above and listed in the Patient Instructions accessible in Encounters.   Signed, Bernardino Bring, PA-C 02/20/2024 9:46 AM      [facility] [place] [address] [place], [place] [address] [phone]

## 2024-02-20 ENCOUNTER — Ambulatory Visit: Attending: Physician Assistant | Admitting: Physician Assistant

## 2024-02-20 ENCOUNTER — Encounter: Payer: Self-pay | Admitting: Physician Assistant

## 2024-02-20 VITALS — BP 140/80 | HR 63 | Ht 66.0 in | Wt 235.8 lb

## 2024-02-20 DIAGNOSIS — E66812 Obesity, class 2: Secondary | ICD-10-CM | POA: Diagnosis not present

## 2024-02-20 DIAGNOSIS — I358 Other nonrheumatic aortic valve disorders: Secondary | ICD-10-CM | POA: Diagnosis present

## 2024-02-20 DIAGNOSIS — Z79899 Other long term (current) drug therapy: Secondary | ICD-10-CM | POA: Insufficient documentation

## 2024-02-20 DIAGNOSIS — Z6839 Body mass index (BMI) 39.0-39.9, adult: Secondary | ICD-10-CM | POA: Insufficient documentation

## 2024-02-20 DIAGNOSIS — E785 Hyperlipidemia, unspecified: Secondary | ICD-10-CM | POA: Insufficient documentation

## 2024-02-20 DIAGNOSIS — I10 Essential (primary) hypertension: Secondary | ICD-10-CM | POA: Insufficient documentation

## 2024-02-20 DIAGNOSIS — I5032 Chronic diastolic (congestive) heart failure: Secondary | ICD-10-CM | POA: Insufficient documentation

## 2024-02-20 MED ORDER — DIGOXIN 125 MCG PO TABS: 0.1250 mg | ORAL_TABLET | Freq: Every day | ORAL | 3 refills | Status: DC

## 2024-02-20 MED ORDER — TORSEMIDE 20 MG PO TABS: 20.0000 mg | ORAL_TABLET | Freq: Every day | ORAL | 3 refills | Status: AC

## 2024-02-20 NOTE — Patient Instructions (Addendum)
 Medication Instructions:  Your physician recommends the following medication changes.  STOP TAKING: Losartan  Digoxin   *If you need a refill on your cardiac medications before your next appointment, please call your pharmacy*  Lab Work: Your provider would like for you to have following labs drawn today BMeT, Lipids, Direct LDL, AST, and ALT.   If you have labs (blood work) drawn today and your tests are completely normal, you will receive your results only by: MyChart Message (if you have MyChart) OR A paper copy in the mail If you have any lab test that is abnormal or we need to change your treatment, we will call you to review the results.  Follow-Up: At [facility], you and your health needs are our priority.  As part of our continuing mission to provide you with exceptional heart care, our providers are all part of one team.  This team includes your primary Cardiologist (physician) and Advanced Practice Providers or APPs (Physician Assistants and Nurse Practitioners) who all work together to provide you with the care you need, when you need it.  Your next appointment:   6 month(s)  Provider:   You may see Deatrice Cage, MD or one of the following Advanced Practice Providers on your designated Care Team:   Lonni Meager, NP Lesley Maffucci, PA-C Bernardino Bring, PA-C Cadence Carrboro, PA-C Tylene Lunch, NP Barnie Hila, NP

## 2024-02-21 ENCOUNTER — Other Ambulatory Visit: Payer: Self-pay | Admitting: Cardiovascular Disease

## 2024-02-21 ENCOUNTER — Ambulatory Visit: Payer: Self-pay | Admitting: Physician Assistant

## 2024-02-21 DIAGNOSIS — E785 Hyperlipidemia, unspecified: Secondary | ICD-10-CM

## 2024-02-21 DIAGNOSIS — Z79899 Other long term (current) drug therapy: Secondary | ICD-10-CM

## 2024-02-21 LAB — BASIC METABOLIC PANEL WITH GFR
BUN/Creatinine Ratio: 14 (ref 12–28)
BUN: 13 mg/dL (ref 8–27)
CO2: 23 mmol/L (ref 20–29)
Calcium: 9.3 mg/dL (ref 8.7–10.3)
Chloride: 102 mmol/L (ref 96–106)
Creatinine, Ser: 0.9 mg/dL (ref 0.57–1.00)
Glucose: 93 mg/dL (ref 70–99)
Potassium: 4.1 mmol/L (ref 3.5–5.2)
Sodium: 142 mmol/L (ref 134–144)
eGFR: 70 mL/min/1.73 (ref >59)

## 2024-02-21 LAB — AST: AST: 27 IU/L (ref 0–40)

## 2024-02-21 LAB — LIPID PANEL
Chol/HDL Ratio: 4.4 ratio (ref 0.0–4.4)
Cholesterol, Total: 188 mg/dL (ref 100–199)
HDL: 43 mg/dL (ref >39)
LDL Chol Calc (NIH): 122 mg/dL — ABNORMAL HIGH (ref 0–99)
Triglycerides: 128 mg/dL (ref 0–149)
VLDL Cholesterol Cal: 23 mg/dL (ref 5–40)

## 2024-02-21 LAB — ALT: ALT: 22 IU/L (ref 0–32)

## 2024-02-21 LAB — LDL CHOLESTEROL, DIRECT: LDL Direct: 127 mg/dL — ABNORMAL HIGH (ref 0–99)

## 2024-02-21 MED ORDER — ATORVASTATIN CALCIUM 40 MG PO TABS: 40.0000 mg | ORAL_TABLET | Freq: Every day | ORAL | 3 refills | Status: AC

## 2024-02-22 ENCOUNTER — Other Ambulatory Visit: Payer: Self-pay | Admitting: Cardiovascular Disease

## 2024-08-19 ENCOUNTER — Ambulatory Visit: Admitting: Physician Assistant
